# Patient Record
Sex: Male | Born: 1950
Health system: Southern US, Community
[De-identification: ages and names within clinical notes are randomized; demographics above are authoritative.]

## PROBLEM LIST (undated history)

## (undated) DIAGNOSIS — S80212A Abrasion, left knee, initial encounter: Secondary | ICD-10-CM

## (undated) DIAGNOSIS — J189 Pneumonia, unspecified organism: Secondary | ICD-10-CM

## (undated) DIAGNOSIS — R079 Chest pain, unspecified: Secondary | ICD-10-CM

## (undated) DIAGNOSIS — J45909 Unspecified asthma, uncomplicated: Secondary | ICD-10-CM

## (undated) DIAGNOSIS — I4891 Unspecified atrial fibrillation: Secondary | ICD-10-CM

## (undated) HISTORY — PX: COLONOSCOPY: SHX174

## (undated) HISTORY — DX: Chest pain, unspecified: R07.9

---

## 2001-12-18 HISTORY — PX: CARDIAC CATHETERIZATION: SHX172

## 2002-05-06 ENCOUNTER — Ambulatory Visit (HOSPITAL_COMMUNITY): Admission: RE | Admit: 2002-05-06 | Discharge: 2002-05-06 | Payer: Self-pay | Admitting: *Deleted

## 2002-06-03 ENCOUNTER — Ambulatory Visit (HOSPITAL_COMMUNITY): Admission: RE | Admit: 2002-06-03 | Discharge: 2002-06-03 | Payer: Self-pay | Admitting: *Deleted

## 2005-04-11 ENCOUNTER — Encounter: Admission: RE | Admit: 2005-04-11 | Discharge: 2005-07-10 | Payer: Self-pay | Admitting: Family Medicine

## 2008-04-15 ENCOUNTER — Ambulatory Visit (HOSPITAL_COMMUNITY): Admission: RE | Admit: 2008-04-15 | Discharge: 2008-04-15 | Payer: Self-pay | Admitting: Interventional Cardiology

## 2008-04-15 ENCOUNTER — Ambulatory Visit: Payer: Self-pay | Admitting: Cardiovascular Disease

## 2011-05-05 NOTE — Cardiovascular Report (Signed)
[place]. [facility]  Patient:    Frank Trujillo, Frank Trujillo Visit Number: [id number] MRN: [id number]          Service Type: CAT Location: [facility] 2852 [id number] Attending Physician:  Mora Appl Dictated by:   Meade Maw, M.D. Proc. Date: 05/06/02 Admit Date:  05/06/2002 Discharge Date: 05/06/2002   CC:         Miguel Aschoff, M.D.   Cardiac Catheterization  REFERRING PHYSICIAN:  Miguel Aschoff, M.D.  INDICATIONS:  Ongoing chest pain with reversible ischemia noted at the apical region. He has significant ST changes as well.  PROCEDURE PERFORMED: 1. Selective coronary angiography. 2. Left ventriculography.  DESCRIPTION OF PROCEDURE:  After obtaining written informed consent, the patient was brought to the cardiac catheterization lab in the postabsorptive state. Preop sedation was achieved using IV Versed. The right groin was prepped and draped in the usual sterile fashion. Local anesthesia was achieved using 1% Xylocaine. A #6 French hemostasis sheath was placed into the right femoral artery using the modified Seldinger technique. Selective coronary angiography was performed using a JL4 and JR4 Judkins catheters. Multiple views were obtained. The right coronary artery was noted to have initial spasm with engagement and intercoronary nitroglycerin was given. Following the procedure there was no immediate complications. The patient was transferred to the holding area. Hemostasis was achieved using digital pressure.  HEMODYNAMIC DATA: 1. The aortic pressure is 112/70 mmHg. 2. Left ventricular pressure is 115/18 mmHg.  LEFT VENTRICULOGRAPHY: 1. Single plane ventriculogram revealed normal wall motion. 2. The ejection fraction is 60%. 3. There was apical hypertrophy noted.  CORONARY ANGIOGRAPHY: 1. The left main coronary artery bifurcates into the left anterior descending    and circumflex vessel. There is no disease in the left main coronary artery. 2. The left  anterior descending gives rise to a small to moderate D1    and moderate D2. It then goes on to end as an apical recurrent    branch. There is no disease in the left anterior descending artery    or its branches. 3. The circumflex coronary artery is a moderate sized vessel. It gives rise    to a small to moderate OM1 and moderate OM2 and goes on to end as    an AV groove vessel. There is no disease in the circumflex coronary artery    or its branches. 4. The right coronary artery is a dominant vessel and gives rise to    three smaller marginals, small PDA and a PL branch. There is no    disease in the right coronary artery or its branches.  IMPRESSION: 1. Normal coronary angiography. 2. Possible spasm of the right coronary artery accounting for chest    pain syndrome; however, the chest pain was not reproduced with spasm    in the cath lab. 3. Normal wall motion with apical hypertrophy. Dictated by:   Meade Maw, M.D. Attending Physician:  Mora Appl DD:  05/06/02 TD:  05/07/02 Job: [id number]

## 2013-12-18 HISTORY — PX: DOPPLER ECHOCARDIOGRAPHY: SHX263

## 2013-12-18 HISTORY — PX: CARDIOVASCULAR STRESS TEST: SHX262

## 2014-06-01 ENCOUNTER — Encounter: Payer: Self-pay | Admitting: Cardiology

## 2014-06-04 ENCOUNTER — Encounter: Payer: Self-pay | Admitting: *Deleted

## 2014-06-05 ENCOUNTER — Encounter (INDEPENDENT_AMBULATORY_CARE_PROVIDER_SITE_OTHER): Payer: Self-pay

## 2014-06-05 ENCOUNTER — Encounter: Payer: Self-pay | Admitting: Cardiology

## 2014-06-05 ENCOUNTER — Ambulatory Visit (INDEPENDENT_AMBULATORY_CARE_PROVIDER_SITE_OTHER): Payer: Managed Care, Other (non HMO) | Admitting: Cardiology

## 2014-06-05 VITALS — BP 149/91 | HR 66 | Ht 74.0 in | Wt 257.0 lb

## 2014-06-05 DIAGNOSIS — I4891 Unspecified atrial fibrillation: Secondary | ICD-10-CM

## 2014-06-05 DIAGNOSIS — I4819 Other persistent atrial fibrillation: Secondary | ICD-10-CM

## 2014-06-05 MED ORDER — RIVAROXABAN 20 MG PO TABS: 20.0000 mg | ORAL_TABLET | Freq: Every day | ORAL | Status: DC

## 2014-06-05 NOTE — Progress Notes (Signed)
HPI The patient presents for evaluation of atrial fibrillation. He was sent by his primary provider who reports a history of a fibrillation one time in 2013 though he has no recollection of this. He has been having some dyspnea with exertion. This has been new onset over a couple of months. He would notice shortness of breath with activities such as walking up an incline. He recovers quickly when he stops. This was not going on prior to this. He has not had any resting shortness of breath and denies any PND or orthopnea. He denies any chest pressure, neck or arm discomfort. He's had no weight gain or edema. He does not feel any palpitations, presyncope or syncope. However, when he saw his primary provider he was noted to be in fibrillation. This of note the patient did have troponins drawn which were normal.  I did review previous records. He was seen for evaluation of chest discomfort in 2003 and had no obstructive coronary disease but did have some coronary spasm. Because of an abnormal EKG and chest discomfort he had MRI in 2009 area this demonstrated no significant abnormalities.  No Known Allergies  Current Outpatient Prescriptions  Medication Sig Dispense Refill  . Fluticasone-Salmeterol (ADVAIR) 100-50 MCG/DOSE AEPB Inhale 1 puff into the lungs 2 (two) times daily.      . rivaroxaban (XARELTO) 20 MG TABS tablet Take 1 tablet (20 mg total) by mouth daily with supper.  30 tablet  11   No current facility-administered medications for this visit.    Past Medical History  Diagnosis Date  . Chest pain     cardiac cath was done by Dr Jaci Standard , he was discharged from cardiac care with no F/u  needed , no cardiac disease  seen  2003  . SOB (shortness of breath)     chest tightness and dyspnea ,eval 2009 Dr Tamala Julian    . Hypertension   . Diabetes mellitus without complication     Past Surgical History  Procedure Laterality Date  . None      Family History  Problem Relation Age of Onset    . Cancer Father     Liver  . CAD Brother 91    CABG/Valve    History   Social History  . Marital Status: Married    Spouse Name: N/A    Number of Children: 2  . Years of Education: N/A   Occupational History  . Not on file.   Social History Main Topics  . Smoking status: Former Research scientist (life sciences)  . Smokeless tobacco: Not on file  . Alcohol Use: Not on file  . Drug Use: Not on file  . Sexual Activity: Not on file   Other Topics Concern  . Not on file   Social History Narrative   Lives at home with wife and daughter.     ROS:  As stated in the HPI and negative for all other systems.   PHYSICAL EXAM BP 149/91  Pulse 66  Ht 6\' 2"  (1.88 m)  Wt 257 lb (116.574 kg)  BMI 32.98 kg/m2 GENERAL:  Well appearing HEENT:  Pupils equal round and reactive, fundi not visualized, oral mucosa unremarkable NECK:  No jugular venous distention, waveform within normal limits, carotid upstroke brisk and symmetric, no bruits, no thyromegaly LYMPHATICS:  No cervical, inguinal adenopathy LUNGS:  Clear to auscultation bilaterally BACK:  No CVA tenderness CHEST:  Unremarkable HEART:  PMI not displaced or sustained,S1 and S2 within normal limits, no S3, no  clicks, no rubs, no murmurs, irregular ABD:  Flat, positive bowel sounds normal in frequency in pitch, no bruits, no rebound, no guarding, no midline pulsatile mass, no hepatomegaly, no splenomegaly EXT:  2 plus pulses throughout, no edema, no cyanosis no clubbing SKIN:  No rashes no nodules NEURO:  Cranial nerves II through XII grossly intact, motor grossly intact throughout PSYCH:  Cognitively intact, oriented to person place and time  EKG:  Atrial fibrillation, rate 74, axis within normal limits, intervals within normal limits, lateral T-wave inversions consistent with ischemia but without old EKGs for comparison, minimal voltage criteria for hypertrophy. 06/05/2014  ASSESSMENT AND PLAN  ATRIAL FIB:  The patient does have some breathlessness  and might benefit from sinus rhythm. He had a long discussion about the physiology of this and methods of treatment. Cardioversion is indicated. I would like to check an echocardiogram. I will order a 24-hour Holter to make sure that this is persistent fibrillation. He will be started on Xarelto.  The risks and benefits of this have been described.  ABNORMAL EKG:  He has no anginal symptoms. I don't have a previous EKG for comparison. However, he's had an extensive workup in the past with an MRI and catheterization. At this point no ischemia evaluation is planned. I would like to try to retrieve an old EKGs.

## 2014-06-05 NOTE — Patient Instructions (Signed)
Please start Xarelto 20 mg a day (with the evening meal) Continue all other medications as listed.  Your physician has requested that you have an echocardiogram. Echocardiography is a painless test that uses sound waves to create images of your heart. It provides your doctor with information about the size and shape of your heart and how well your heart's chambers and valves are working. This procedure takes approximately one hour. There are no restrictions for this procedure.  Your physician has recommended that you wear a holter monitor for 24 hours. Holter monitors are medical devices that record the heart's electrical activity. Doctors most often use these monitors to diagnose arrhythmias. Arrhythmias are problems with the speed or rhythm of the heartbeat. The monitor is a small, portable device. You can wear one while you do your normal daily activities. This is usually used to diagnose what is causing palpitations/syncope (passing out).  Your physician has recommended that you have a Cardioversion (DCCV) in approximately 4 weeks.  You will be called at a later date for this to be scheduled.  Electrical Cardioversion uses a jolt of electricity to your heart either through paddles or wired patches attached to your chest. This is a controlled, usually prescheduled, procedure. Defibrillation is done under light anesthesia in the hospital, and you usually go home the day of the procedure. This is done to get your heart back into a normal rhythm. You are not awake for the procedure. Please see the instruction sheet given to you today.

## 2014-06-16 ENCOUNTER — Encounter (INDEPENDENT_AMBULATORY_CARE_PROVIDER_SITE_OTHER): Payer: Managed Care, Other (non HMO)

## 2014-06-16 ENCOUNTER — Encounter: Payer: Self-pay | Admitting: *Deleted

## 2014-06-16 ENCOUNTER — Ambulatory Visit (HOSPITAL_COMMUNITY)
Admission: RE | Admit: 2014-06-16 | Discharge: 2014-06-16 | Disposition: A | Discharge status: Home / Self Care | Payer: Managed Care, Other (non HMO) | Source: Ambulatory Visit | Attending: Cardiovascular Disease | Admitting: Cardiovascular Disease

## 2014-06-16 DIAGNOSIS — I4891 Unspecified atrial fibrillation: Secondary | ICD-10-CM

## 2014-06-16 DIAGNOSIS — I517 Cardiomegaly: Secondary | ICD-10-CM

## 2014-06-16 DIAGNOSIS — I4819 Other persistent atrial fibrillation: Secondary | ICD-10-CM

## 2014-06-16 NOTE — Progress Notes (Signed)
Patient ID: Frank Trujillo, male   DOB: [birth year], 63 y.o.   MRN: [id number] E-Cardio 24 hour holter monitor applied to patient.

## 2014-06-16 NOTE — Progress Notes (Signed)
2D Echo Performed 06/16/2014    Marygrace Drought, RCS

## 2014-06-24 ENCOUNTER — Telehealth: Payer: Self-pay | Admitting: Cardiology

## 2014-06-24 NOTE — Telephone Encounter (Signed)
New problem   Pt want to know results of his Echocardiogram. Please call pt.

## 2014-06-24 NOTE — Telephone Encounter (Signed)
New problem    Pt want to know results of his Echocardiogram

## 2014-06-24 NOTE — Telephone Encounter (Signed)
F/u     Pt wanting results.

## 2014-06-25 NOTE — Telephone Encounter (Signed)
Pt still have not received his echo results.

## 2014-06-25 NOTE — Telephone Encounter (Signed)
Returned call to patient echo results given.Appointment scheduled with Dr.Hochrein 07/13/14 to schedule cardioversion.

## 2014-06-29 NOTE — Telephone Encounter (Signed)
ok 

## 2014-07-10 ENCOUNTER — Telehealth: Payer: Self-pay | Admitting: Cardiology

## 2014-07-10 MED ORDER — RIVAROXABAN 20 MG PO TABS: 20.0000 mg | ORAL_TABLET | Freq: Every day | ORAL | Status: DC

## 2014-07-10 NOTE — Telephone Encounter (Signed)
Spoke to [employer] D NO patient needs medication. RN informed patient  3 tabs  E -SENT to [facility]

## 2014-07-10 NOTE — Telephone Encounter (Signed)
Pt is out of town and left his Xarelto at home. Will it be all right for him to be without this for 2 days?If not please call and let him know.

## 2014-07-13 ENCOUNTER — Encounter: Payer: Self-pay | Admitting: Cardiology

## 2014-07-13 ENCOUNTER — Ambulatory Visit (INDEPENDENT_AMBULATORY_CARE_PROVIDER_SITE_OTHER): Payer: Managed Care, Other (non HMO) | Admitting: Cardiology

## 2014-07-13 VITALS — BP 134/80 | HR 65 | Ht 74.0 in | Wt 258.0 lb

## 2014-07-13 DIAGNOSIS — I482 Chronic atrial fibrillation, unspecified: Secondary | ICD-10-CM

## 2014-07-13 DIAGNOSIS — I4891 Unspecified atrial fibrillation: Secondary | ICD-10-CM

## 2014-07-13 DIAGNOSIS — R0602 Shortness of breath: Secondary | ICD-10-CM

## 2014-07-13 DIAGNOSIS — R5381 Other malaise: Secondary | ICD-10-CM

## 2014-07-13 DIAGNOSIS — Z79899 Other long term (current) drug therapy: Secondary | ICD-10-CM

## 2014-07-13 DIAGNOSIS — D689 Coagulation defect, unspecified: Secondary | ICD-10-CM

## 2014-07-13 DIAGNOSIS — R5383 Other fatigue: Secondary | ICD-10-CM

## 2014-07-13 NOTE — Progress Notes (Signed)
HPI The patient presents for evaluation of atrial fibrillation. This is his second visit.  After the first visit I ordered an echocardiogram which demonstrated mild LAE but no significant valvular disease and a normal EF.  He has tolerated Xarelto.  He does not notice palpitations and he has had no syncope or presyncope.  However, he does have some dyspnea as described in the previous note.  He denies PND or orthopnea.  He has had no chest, neck or arm pain.    No Known Allergies  Current Outpatient Prescriptions  Medication Sig Dispense Refill  . Fluticasone-Salmeterol (ADVAIR) 100-50 MCG/DOSE AEPB Inhale 1 puff into the lungs 2 (two) times daily.      . rivaroxaban (XARELTO) 20 MG TABS tablet Take 1 tablet (20 mg total) by mouth daily with supper.  3 tablet  0   No current facility-administered medications for this visit.    Past Medical History  Diagnosis Date  . Chest pain     cardiac cath was done by Dr Jaci Standard , he was discharged from cardiac care with no F/u  needed , no cardiac disease  seen  2003  . SOB (shortness of breath)     chest tightness and dyspnea ,eval 2009 Dr Tamala Julian    . Hypertension   . Diabetes mellitus without complication     Past Surgical History  Procedure Laterality Date  . None      Family History  Problem Relation Age of Onset  . Cancer Father     Liver  . CAD Brother 62    CABG/Valve    History   Social History  . Marital Status: Married    Spouse Name: N/A    Number of Children: 2  . Years of Education: N/A   Occupational History  . Not on file.   Social History Main Topics  . Smoking status: Former Research scientist (life sciences)  . Smokeless tobacco: Not on file  . Alcohol Use: Not on file  . Drug Use: Not on file  . Sexual Activity: Not on file   Other Topics Concern  . Not on file   Social History Narrative   Lives at home with wife and daughter.     ROS:  As stated in the HPI and negative for all other systems.   PHYSICAL EXAM BP 134/80   Pulse 65  Ht 6\' 2"  (1.88 m)  Wt 258 lb (117.028 kg)  BMI 33.11 kg/m2 GENERAL:  Well appearing HEENT:  Pupils equal round and reactive, fundi not visualized, oral mucosa unremarkable NECK:  No jugular venous distention, waveform within normal limits, carotid upstroke brisk and symmetric, no bruits, no thyromegaly LYMPHATICS:  No cervical, inguinal adenopathy LUNGS:  Clear to auscultation bilaterally BACK:  No CVA tenderness CHEST:  Unremarkable HEART:  PMI not displaced or sustained,S1 and S2 within normal limits, no S3, no clicks, no rubs, no murmurs, irregular ABD:  Flat, positive bowel sounds normal in frequency in pitch, no bruits, no rebound, no guarding, no midline pulsatile mass, no hepatomegaly, no splenomegaly EXT:  2 plus pulses throughout, no edema, no cyanosis no clubbing SKIN:  No rashes no nodules NEURO:  Cranial nerves II through XII grossly intact, motor grossly intact throughout PSYCH:  Cognitively intact, oriented to person place and time  EKG:  Atrial fibrillation, rate 65, axis within normal limits, intervals within normal limits, lateral T-wave inversions consistent with ischemia but without old EKGs for comparison, minimal voltage criteria for hypertrophy. 07/13/2014  ASSESSMENT AND  PLAN  ATRIAL FIB:  The patient had a normal echo and permanent atrial fib.  He thinks that he might be slightly more SOB with his fibrillation.  He has missed a couple of doses of Xarelto.  I will plan DCCV in another three weeks after he has been on uninterrupted Xarelto.  We discussed the risk benefits.  I reviewed his risk factors for emboli.  He reports that he actually does not have HTN and his last A1C was 6.  He will not need a NOAC long term at this point although we will continue through the cardioversion and for one month beyond.

## 2014-07-13 NOTE — Patient Instructions (Signed)
Your physician recommends that you schedule a follow-up appointment in: after your cardioversion in one month  It is very important that you do not miss any doses of your Xarelto

## 2014-07-20 ENCOUNTER — Encounter: Payer: Self-pay | Admitting: Cardiology

## 2014-07-20 ENCOUNTER — Telehealth: Payer: Self-pay | Admitting: Cardiology

## 2014-07-20 NOTE — Telephone Encounter (Signed)
Spoke with patient regarding the cardioversion ordered by Dr. Percival Spanish. Scheduled for Monday 08/10/14 at 1:00pm.  I informed patient that I would mail instructions to him regarding time of procedure, arrival time at hospital, etc.  Patient voiced understanding.

## 2014-07-28 ENCOUNTER — Encounter (HOSPITAL_COMMUNITY): Payer: Self-pay | Admitting: Pharmacy Technician

## 2014-08-03 LAB — CBC
HEMATOCRIT: 44.3 % (ref 39.0–52.0)
HEMOGLOBIN: 15.8 g/dL (ref 13.0–17.0)
MCH: 33.3 pg (ref 26.0–34.0)
MCHC: 35.7 g/dL (ref 30.0–36.0)
MCV: 93.3 fL (ref 78.0–100.0)
Platelets: 217 10*3/uL (ref 150–400)
RBC: 4.75 MIL/uL (ref 4.22–5.81)
RDW: 13.3 % (ref 11.5–15.5)
WBC: 7.7 10*3/uL (ref 4.0–10.5)

## 2014-08-03 LAB — PROTIME-INR
INR: 1.26 (ref <1.50)
PROTHROMBIN TIME: 15.8 s — AB (ref 11.6–15.2)

## 2014-08-04 LAB — COMPREHENSIVE METABOLIC PANEL
ALK PHOS: 113 U/L (ref 39–117)
ALT: 28 U/L (ref 0–53)
AST: 19 U/L (ref 0–37)
Albumin: 4.4 g/dL (ref 3.5–5.2)
BILIRUBIN TOTAL: 0.6 mg/dL (ref 0.2–1.2)
BUN: 13 mg/dL (ref 6–23)
CO2: 27 meq/L (ref 19–32)
CREATININE: 0.84 mg/dL (ref 0.50–1.35)
Calcium: 9 mg/dL (ref 8.4–10.5)
Chloride: 101 mEq/L (ref 96–112)
Glucose, Bld: 191 mg/dL — ABNORMAL HIGH (ref 70–99)
Potassium: 4.5 mEq/L (ref 3.5–5.3)
Sodium: 138 mEq/L (ref 135–145)
Total Protein: 6.8 g/dL (ref 6.0–8.3)

## 2014-08-04 LAB — TSH: TSH: 3.107 u[IU]/mL (ref 0.350–4.500)

## 2014-08-04 LAB — APTT: aPTT: 35 seconds (ref 24–37)

## 2014-08-10 ENCOUNTER — Encounter (HOSPITAL_COMMUNITY): Payer: Self-pay

## 2014-08-10 ENCOUNTER — Ambulatory Visit (HOSPITAL_COMMUNITY)
Admission: RE | Admit: 2014-08-10 | Discharge: 2014-08-10 | Disposition: A | Discharge status: Home / Self Care | Payer: Managed Care, Other (non HMO) | Source: Ambulatory Visit | Attending: Cardiology | Admitting: Cardiology

## 2014-08-10 ENCOUNTER — Ambulatory Visit (HOSPITAL_COMMUNITY): Payer: Managed Care, Other (non HMO) | Admitting: Anesthesiology

## 2014-08-10 ENCOUNTER — Encounter (HOSPITAL_COMMUNITY): Payer: Managed Care, Other (non HMO) | Admitting: Anesthesiology

## 2014-08-10 ENCOUNTER — Encounter (HOSPITAL_COMMUNITY)
Admission: RE | Disposition: A | Discharge status: Home / Self Care | Payer: Self-pay | Source: Ambulatory Visit | Attending: Cardiology

## 2014-08-10 ENCOUNTER — Other Ambulatory Visit: Payer: Self-pay | Admitting: *Deleted

## 2014-08-10 DIAGNOSIS — Z79899 Other long term (current) drug therapy: Secondary | ICD-10-CM | POA: Diagnosis not present

## 2014-08-10 DIAGNOSIS — Z87891 Personal history of nicotine dependence: Secondary | ICD-10-CM | POA: Diagnosis not present

## 2014-08-10 DIAGNOSIS — Z7901 Long term (current) use of anticoagulants: Secondary | ICD-10-CM | POA: Diagnosis not present

## 2014-08-10 DIAGNOSIS — I4891 Unspecified atrial fibrillation: Secondary | ICD-10-CM | POA: Insufficient documentation

## 2014-08-10 HISTORY — PX: CARDIOVERSION: SHX1299

## 2014-08-10 SURGERY — CARDIOVERSION
Anesthesia: General

## 2014-08-10 MED ORDER — SODIUM CHLORIDE 0.9 % IV SOLN
INTRAVENOUS | Status: DC
  Administered 2014-08-10: 500 mL via INTRAVENOUS

## 2014-08-10 MED ORDER — SODIUM CHLORIDE 0.9 % IV SOLN: INTRAVENOUS | Status: DC

## 2014-08-10 MED ORDER — LIDOCAINE HCL (CARDIAC) 20 MG/ML IV SOLN
INTRAVENOUS | Status: DC | PRN
  Administered 2014-08-10: 40 mg via INTRAVENOUS

## 2014-08-10 MED ORDER — SODIUM CHLORIDE 0.9 % IV SOLN
INTRAVENOUS | Status: DC | PRN
  Administered 2014-08-10: 13:00:00 via INTRAVENOUS

## 2014-08-10 MED ORDER — HYDROCORTISONE 1 % EX CREA
1.0000 "application " | TOPICAL_CREAM | Freq: Three times a day (TID) | CUTANEOUS | Status: DC | PRN
  Filled 2014-08-10: qty 28

## 2014-08-10 MED ORDER — PROPOFOL 10 MG/ML IV BOLUS
INTRAVENOUS | Status: DC | PRN
  Administered 2014-08-10: 50 mg via INTRAVENOUS
  Administered 2014-08-10: 30 mg via INTRAVENOUS
  Administered 2014-08-10: 10 mg via INTRAVENOUS

## 2014-08-10 NOTE — Transfer of Care (Signed)
Immediate Anesthesia Transfer of Care Note  Patient: Frank Trujillo  Procedure(s) Performed: Procedure(s): CARDIOVERSION (N/A)  Patient Location: Endoscopy Unit  Anesthesia Type:MAC  Level of Consciousness: awake, alert  and oriented  Airway & Oxygen Therapy: Patient Spontanous Breathing  Post-op Assessment: Report given to PACU RN  Post vital signs: stable  Complications: No apparent anesthesia complications

## 2014-08-10 NOTE — Interval H&P Note (Signed)
History and Physical Interval Note:  08/10/2014 1:54 PM  Frank Trujillo  has presented today for surgery, with the diagnosis of A FIB  The various methods of treatment have been discussed with the patient and family. After consideration of risks, benefits and other options for treatment, the patient has consented to  Procedure(s): CARDIOVERSION (N/A) as a surgical intervention .  The patient's history has been reviewed, patient examined, no change in status, stable for surgery.  I have reviewed the patient's chart and labs.  Questions were answered to the patient's satisfaction.     Minus Breeding

## 2014-08-10 NOTE — Anesthesia Preprocedure Evaluation (Addendum)
Anesthesia Evaluation  Patient identified by MRN, date of birth, ID band Patient awake    Reviewed: Allergy & Precautions, H&P , NPO status , Patient's Chart, lab work & pertinent test results  Airway Mallampati: II TM Distance: >3 FB Neck ROM: Full    Dental no notable dental hx.    Pulmonary shortness of breath, former smoker,  breath sounds clear to auscultation  Pulmonary exam normal       Cardiovascular hypertension, Pt. on medications Rhythm:Regular Rate:Normal  Echo 05/2014 - Left ventricle: The cavity size was normal. Systolic function was  normal. The estimated ejection fraction was in the range of 55%  to 60%. Wall motion was normal; there were no regional wall  motion abnormalities. - Mitral valve: There was trivial regurgitation. - Left atrium: The atrium was mildly dilated. - Pulmonic valve: There was trivial regurgitation.    Neuro/Psych negative neurological ROS  negative psych ROS   GI/Hepatic negative GI ROS, Neg liver ROS,   Endo/Other  diabetes, Type 2, Oral Hypoglycemic Agents  Renal/GU negative Renal ROS     Musculoskeletal negative musculoskeletal ROS (+)   Abdominal   Peds  Hematology negative hematology ROS (+)   Anesthesia Other Findings   Reproductive/Obstetrics                       Anesthesia Physical Anesthesia Plan  ASA: II  Anesthesia Plan: General   Post-op Pain Management:    Induction: Intravenous  Airway Management Planned: Mask  Additional Equipment:   Intra-op Plan:   Post-operative Plan:   Informed Consent: I have reviewed the patients History and Physical, chart, labs and discussed the procedure including the risks, benefits and alternatives for the proposed anesthesia with the patient or authorized representative who has indicated his/her understanding and acceptance.   Dental advisory given  Plan Discussed with: CRNA  Anesthesia Plan  Comments:         Anesthesia Quick Evaluation

## 2014-08-10 NOTE — CV Procedure (Signed)
   Procedure:   DCCV  Indication:  Symptomatic atrial fib.  Procedure Note:  The patient signed informed consent.  He has had had therapeutic anticoagulation with Xarelto greater than 3 weeks.  Anesthesia was administered by Dr. Ermalene Postin.  Adequate airway was maintained throughout and vital followed per protocol.  He was cardioverted x 2 with 150 and the 200J of biphasic synchronized energy.  He converted to NSR for only 6 or so beats.  There were no apparent complications.  The patient had normal neuro status and respiratory status post procedure with vitals stable as recorded elsewhere.    Follow up:  We will arrange follow up with me.  He can stop his Xarelto and start a baby ASA.

## 2014-08-10 NOTE — H&P (View-Only) (Signed)
HPI The patient presents for evaluation of atrial fibrillation. This is his second visit.  After the first visit I ordered an echocardiogram which demonstrated mild LAE but no significant valvular disease and a normal EF.  He has tolerated Xarelto.  He does not notice palpitations and he has had no syncope or presyncope.  However, he does have some dyspnea as described in the previous note.  He denies PND or orthopnea.  He has had no chest, neck or arm pain.    No Known Allergies  Current Outpatient Prescriptions  Medication Sig Dispense Refill  . Fluticasone-Salmeterol (ADVAIR) 100-50 MCG/DOSE AEPB Inhale 1 puff into the lungs 2 (two) times daily.      . rivaroxaban (XARELTO) 20 MG TABS tablet Take 1 tablet (20 mg total) by mouth daily with supper.  3 tablet  0   No current facility-administered medications for this visit.    Past Medical History  Diagnosis Date  . Chest pain     cardiac cath was done by Dr Jaci Standard , he was discharged from cardiac care with no F/u  needed , no cardiac disease  seen  2003  . SOB (shortness of breath)     chest tightness and dyspnea ,eval 2009 Dr Tamala Julian    . Hypertension   . Diabetes mellitus without complication     Past Surgical History  Procedure Laterality Date  . None      Family History  Problem Relation Age of Onset  . Cancer Father     Liver  . CAD Brother 37    CABG/Valve    History   Social History  . Marital Status: Married    Spouse Name: N/A    Number of Children: 2  . Years of Education: N/A   Occupational History  . Not on file.   Social History Main Topics  . Smoking status: Former Research scientist (life sciences)  . Smokeless tobacco: Not on file  . Alcohol Use: Not on file  . Drug Use: Not on file  . Sexual Activity: Not on file   Other Topics Concern  . Not on file   Social History Narrative   Lives at home with wife and daughter.     ROS:  As stated in the HPI and negative for all other systems.   PHYSICAL EXAM BP 134/80   Pulse 65  Ht 6\' 2"  (1.88 m)  Wt 258 lb (117.028 kg)  BMI 33.11 kg/m2 GENERAL:  Well appearing HEENT:  Pupils equal round and reactive, fundi not visualized, oral mucosa unremarkable NECK:  No jugular venous distention, waveform within normal limits, carotid upstroke brisk and symmetric, no bruits, no thyromegaly LYMPHATICS:  No cervical, inguinal adenopathy LUNGS:  Clear to auscultation bilaterally BACK:  No CVA tenderness CHEST:  Unremarkable HEART:  PMI not displaced or sustained,S1 and S2 within normal limits, no S3, no clicks, no rubs, no murmurs, irregular ABD:  Flat, positive bowel sounds normal in frequency in pitch, no bruits, no rebound, no guarding, no midline pulsatile mass, no hepatomegaly, no splenomegaly EXT:  2 plus pulses throughout, no edema, no cyanosis no clubbing SKIN:  No rashes no nodules NEURO:  Cranial nerves II through XII grossly intact, motor grossly intact throughout PSYCH:  Cognitively intact, oriented to person place and time  EKG:  Atrial fibrillation, rate 65, axis within normal limits, intervals within normal limits, lateral T-wave inversions consistent with ischemia but without old EKGs for comparison, minimal voltage criteria for hypertrophy. 07/13/2014  ASSESSMENT AND  PLAN  ATRIAL FIB:  The patient had a normal echo and permanent atrial fib.  He thinks that he might be slightly more SOB with his fibrillation.  He has missed a couple of doses of Xarelto.  I will plan DCCV in another three weeks after he has been on uninterrupted Xarelto.  We discussed the risk benefits.  I reviewed his risk factors for emboli.  He reports that he actually does not have HTN and his last A1C was 6.  He will not need a NOAC long term at this point although we will continue through the cardioversion and for one month beyond.

## 2014-08-10 NOTE — Anesthesia Postprocedure Evaluation (Signed)
  Anesthesia Post-op Note  Patient: Frank Trujillo  Procedure(s) Performed: Procedure(s): CARDIOVERSION (N/A)  Patient Location: PACU  Anesthesia Type:MAC  Level of Consciousness: awake and alert   Airway and Oxygen Therapy: Patient Spontanous Breathing  Post-op Pain: none  Post-op Assessment: Post-op Vital signs reviewed, Patient's Cardiovascular Status Stable, Respiratory Function Stable, Patent Airway, No signs of Nausea or vomiting and Pain level controlled  Post-op Vital Signs: Reviewed and stable  Last Vitals:  Filed Vitals:   08/10/14 1435  BP: 109/72  Pulse: 68  Temp:   Resp: 22    Complications: No apparent anesthesia complications

## 2014-08-11 ENCOUNTER — Encounter (HOSPITAL_COMMUNITY): Payer: Self-pay | Admitting: Cardiology

## 2014-09-23 ENCOUNTER — Encounter: Payer: Self-pay | Admitting: Cardiology

## 2014-09-23 ENCOUNTER — Ambulatory Visit (INDEPENDENT_AMBULATORY_CARE_PROVIDER_SITE_OTHER): Payer: Managed Care, Other (non HMO) | Admitting: Cardiology

## 2014-09-23 VITALS — BP 131/84 | HR 74 | Ht 74.0 in | Wt 261.6 lb

## 2014-09-23 DIAGNOSIS — I4891 Unspecified atrial fibrillation: Secondary | ICD-10-CM

## 2014-09-23 NOTE — Progress Notes (Signed)
   HPI The patient presents for evaluation of atrial fibrillation. This is his third visit.  After the first visit I ordered an echocardiogram which demonstrated mild LAE but no significant valvular disease and a normal EF.  He was treated with Xarelto and had cardioversion. However, I was unable to keep him in sinus rhythm for more than several weeks. I took him off of his Xarelto at that point and brought him back today for followup. He doesn't notice the palpitations though he does think he has some very mild dyspnea. He denies any chest pressure, neck or arm discomfort. He said no weight gain or edema. He said no presyncope or syncope. He remains relatively active.  No Known Allergies  Current Outpatient Prescriptions  Medication Sig Dispense Refill  . Fluticasone-Salmeterol (ADVAIR) 250-50 MCG/DOSE AEPB Inhale 1 puff into the lungs daily.       No current facility-administered medications for this visit.    Past Medical History  Diagnosis Date  . Chest pain     cardiac cath was done by Dr Jaci Standard , he was discharged from cardiac care with no F/u  needed , no cardiac disease  seen  2003  . SOB (shortness of breath)     chest tightness and dyspnea ,eval 2009 Dr Tamala Julian    . Hypertension   . Diabetes mellitus without complication     Past Surgical History  Procedure Laterality Date  . None    . Colonoscopy    . Cardioversion N/A 08/10/2014    Procedure: CARDIOVERSION;  Surgeon: Minus Breeding, MD;  Location: [facility] ENDOSCOPY;  Service: Cardiovascular;  Laterality: N/A;     ROS:  As stated in the HPI and negative for all other systems.   PHYSICAL EXAM BP 131/84  Pulse 74  Ht 6\' 2"  (1.88 m)  Wt 261 lb 9.6 oz (118.661 kg)  BMI 33.57 kg/m2 GENERAL:  Well appearing NECK:  No jugular venous distention, waveform within normal limits, carotid upstroke brisk and symmetric, no bruits, no thyromegaly LUNGS:  Clear to auscultation bilaterally BACK:  No CVA tenderness CHEST:   Unremarkable HEART:  PMI not displaced or sustained,S1 and S2 within normal limits, no S3, no clicks, no rubs, no murmurs, irregular ABD:  Flat, positive bowel sounds normal in frequency in pitch, no bruits, no rebound, no guarding, no midline pulsatile mass, no hepatomegaly, no splenomegaly EXT:  2 plus pulses throughout, no edema, no cyanosis no clubbing   ASSESSMENT AND PLAN  ATRIAL FIB:  The patient had a normal echo and permanent atrial fib.  At this point I would like this patient to be considered earlier for possible ablation so that he might avoid anticoagulation in the future and along future antiarrhythmics. I do suspect Dr. Rayann Heman, who I will ask to see the patient in consultation, will likely want to try antiarrhythmic first. In anticipation of that and I will send the patient for a POET (Plain Old Exercise Treadmill).  Of note he had some minimal coronary plaque years ago. For now he will remain on aspirin.

## 2014-09-23 NOTE — Patient Instructions (Signed)
We are ordering a treadmill test  We are also ordering a referral to Dr. Rayann Heman

## 2014-10-09 ENCOUNTER — Telehealth (HOSPITAL_COMMUNITY): Payer: Self-pay

## 2014-10-09 NOTE — Telephone Encounter (Signed)
Encounter complete. 

## 2014-10-13 ENCOUNTER — Telehealth (HOSPITAL_COMMUNITY): Payer: Self-pay

## 2014-10-13 NOTE — Telephone Encounter (Signed)
Encounter complete. 

## 2014-10-14 ENCOUNTER — Encounter: Payer: Self-pay | Admitting: Internal Medicine

## 2014-10-14 ENCOUNTER — Ambulatory Visit (HOSPITAL_COMMUNITY)
Admission: RE | Admit: 2014-10-14 | Discharge: 2014-10-14 | Disposition: A | Discharge status: Home / Self Care | Payer: Managed Care, Other (non HMO) | Source: Ambulatory Visit | Attending: Cardiovascular Disease | Admitting: Cardiovascular Disease

## 2014-10-14 ENCOUNTER — Ambulatory Visit (INDEPENDENT_AMBULATORY_CARE_PROVIDER_SITE_OTHER): Payer: Managed Care, Other (non HMO) | Admitting: Internal Medicine

## 2014-10-14 VITALS — BP 146/88 | HR 74 | Ht 74.0 in | Wt 259.8 lb

## 2014-10-14 DIAGNOSIS — I4891 Unspecified atrial fibrillation: Secondary | ICD-10-CM | POA: Diagnosis not present

## 2014-10-14 DIAGNOSIS — I481 Persistent atrial fibrillation: Secondary | ICD-10-CM

## 2014-10-14 DIAGNOSIS — I4892 Unspecified atrial flutter: Secondary | ICD-10-CM | POA: Insufficient documentation

## 2014-10-14 DIAGNOSIS — I4819 Other persistent atrial fibrillation: Secondary | ICD-10-CM

## 2014-10-14 DIAGNOSIS — R0683 Snoring: Secondary | ICD-10-CM

## 2014-10-14 MED ORDER — RIVAROXABAN 20 MG PO TABS: 20.0000 mg | ORAL_TABLET | Freq: Every day | ORAL | Status: DC

## 2014-10-14 NOTE — Progress Notes (Signed)
Primary Care Physician:  Melinda Crutch, MD Primary Cardiologist:Dr. [facility] Primary Electrophysiologist:Dr. Hartwell Vandiver Referring Physician:Dr. Camaron Cammack Frank Trujillo is a 63 y.o. male with a h/o  persistent atrial fibrillation who presents for consultation in the [facility].  The patient was initially diagnosed with PAF several years ago but thinks he has ben in persistent AF  for the last year. For the most part he says his is unaware of the irregular heart beat,can but does have dyspnea with exertion. He is pending a stress test this pm. Cardiac cath in 2003 which showed normal coronary arteries. He underwent unsuccessful DCCV 08/10/14 only achieving SR a few beats per patient. Echo revealed mild left atrial enlargement.   Today, he denies symptoms of palpitations, chest pain,  orthopnea, PND, lower extremity edema, dizziness, presyncope, syncope, snoring, daytime somnolence, bleeding, or neurologic sequela. The patient is tolerating medications without difficulties and is otherwise without complaint today.    Atrial Fibrillation Risk Factors:  he does have symptoms or diagnosis of sleep apnea.(snoring)   he does not have a history of rheumatic fever.  he does not have a history of alcohol use.  he has a BMI of Body mass index is 33.34 kg/(m^2).Marland Kitchen Filed Weights   10/14/14 0833  Weight: 259 lb 12.8 oz (117.845 kg)    LA size: 59mm   Atrial Fibrillation Management history:  Previous antiarrhythmic drugs -none  Previous cardioversions: -x1  Previous ablations: none  CHADS2VASC score: 0-1( pt states HTN and DM in past, but not currently.) Glucose of 191 noted labs 08/03/14  Anticoagulation history: Xarelto short term for DCCV, ASA currently   Past Medical History  Diagnosis Date  . Chest pain     cardiac cath was done by Dr Jaci Standard , he was discharged from cardiac care with no F/u  needed , no cardiac disease  seen  2003  . SOB (shortness of  breath)     chest tightness and dyspnea ,eval 2009 Dr Tamala Julian    . Hypertension   . Diabetes mellitus without complication    Past Surgical History  Procedure Laterality Date  . None    . Colonoscopy    . Cardioversion N/A 08/10/2014    Procedure: CARDIOVERSION;  Surgeon: Minus Breeding, MD;  Location: [facility] ENDOSCOPY;  Service: Cardiovascular;  Laterality: N/A;    Current Outpatient Prescriptions  Medication Sig Dispense Refill  . Fluticasone-Salmeterol (ADVAIR) 250-50 MCG/DOSE AEPB Inhale 1 puff into the lungs 2 (two) times daily.       . rivaroxaban (XARELTO) 20 MG TABS tablet Take 1 tablet (20 mg total) by mouth daily with supper.  30 tablet  11   No current facility-administered medications for this visit.    No Known Allergies  History   Social History  . Marital Status: Married    Spouse Name: N/A    Number of Children: 2  . Years of Education: N/A   Occupational History  . Not on file.   Social History Main Topics  . Smoking status: Former Research scientist (life sciences)  . Smokeless tobacco: Not on file  . Alcohol Use: No  . Drug Use: No  . Sexual Activity: Not on file   Other Topics Concern  . Not on file   Social History Narrative   Lives at home with wife and daughter.     Family History  Problem Relation Age of Onset  . Cancer Father     Liver  . CAD Brother 90  CABG/Valve   The patient does not have a history of early familial atrial fibrillation or other arrhythmias.  ROS- All systems are reviewed and negative except as per the HPI above.  Physical Exam: Filed Vitals:   10/14/14 0833  BP: 146/88  Pulse: 74  Height: 6\' 2"  (1.88 m)  Weight: 259 lb 12.8 oz (117.845 kg)    GEN- The patient is well appearing, alert and oriented x 3 today.   Head- normocephalic, atraumatic Eyes-  Sclera clear, conjunctiva pink Ears- hearing intact Oropharynx- clear Neck- supple, no JVP Lymph- no cervical lymphadenopathy Lungs- Clear to ausculation bilaterally, normal work of  breathing Heart- Regular rate and rhythm, no murmurs, rubs or gallops, PMI not laterally displaced GI- soft, NT, ND, + BS Extremities- no clubbing, cyanosis, or edema MS- no significant deformity or atrophy Skin- no rash or lesion Psych- euthymic mood, full affect Neuro- strength and sensation are intact  EKG Afib rate controlled@ 74 bpm. Voltage criteria for LVH. ST and marked T wave abnormality, QTc at 425ms.   Echo- Left ventricle: The cavity size was normal. Systolic function was normal. The estimated ejection fraction was in the range of 55% to 60%. Wall motion was normal; there were no regional wall motion abnormalities. - Mitral valve: There was trivial regurgitation. - Left atrium: The atrium was mildly dilated. - Pulmonic valve: There was trivial regurgitation   Epic records are reviewed at length today  Assessment and Plan:  1. Atrial fibrillation The patient has minimally symptomatic persistent atrial fibrillation.  The patients CHAD2VASC score is 0-1  he is  appropriately anticoagulated at this time. The patient is adequately rate controlled without meds .Marland Kitchen Antiarrhythmic therapy to dates has included- none  The patients left atrial size is 85mm.  Additional echo findings include normal EF. A long discussion with the patient was had today regarding therapeutic strategies.  Extensive discussion of lifestyle modification including begin progressive daily aerobic exercise program and attempt to lose weight was also discussed.  Presently, our recommendations include:  1. Discussed with pt that it is worthwhile to try to return SR to see if dyspnea with exertion improves. According to guidelines, with pt minimally symptomatic, and never trying an antiarrythmic, it would be prudent to try antiarrhythmic first.  Therefore, will anticoagulant  with Xarelto 20 mg qd  x 3 weeks. He will return to AF clinic that time and will be started on Flecainide 100 mg bid and metoprolol  succinate 12.5 mg qd.   Return in one week after flecainide started for EKG. IF still hasn't converted, schedule  DCCV..  2. Morbid obesity As above, lifestyle modification was discussed at length including regular exercise and weight reduction.  3. Snoring history No significant h/o sleep apnea but does snore. Consider sleep study in future of symptoms are not improving  4. F//u with Dr. Rayann Heman in 2 months.       Roderic Palau NP  Nurse Practitioner, [facility] 10/15/2014 9:09 AM   I have seen, examined the patient, and reviewed the above assessment and plan.  Changes to above are made where necessary.  He appears to be minimally symptomatic with afib (which appears to be longstanding).  I would like to assess for symptomatic improvement with sinus rhythm.  I will therefore restart xarelto and then add flecainide after 3 weeks of anticoagulation.  If he does not spontaneously convert on flecainide then would advise cardioversion 1 week later.  Return to see me 4 weeks  after cardioversion.  If he does not improve symptomatically then rate control would be a reasonable long term strategy.  If his symptoms are improved then rhythm control would be advised long term.  Co Sign: Thompson Grayer, MD 10/15/2014 9:09 AM

## 2014-10-14 NOTE — Procedures (Signed)
Exercise Treadmill Test  Test  Exercise Tolerance Test Ordering MD: Marijo File, MD    Unique Test No: 1  Treadmill:  1  Indication for ETT: exertional dyspnea  Contraindication to ETT: No   Stress Modality: exercise - treadmill  Cardiac Imaging Performed: non   Protocol: standard Bruce - maximal  Max BP:  219/78  Max MPHR (bpm):  157 85% MPR (bpm):  133  MPHR obtained (bpm):  196 % MPHR obtained:  124  Reached 85% MPHR (min:sec):  1:45 Total Exercise Time (min-sec):  5  Workload in METS:  7.0 Borg Scale: 14  Reason ETT Terminated:  ST changes, DOE and fatigue    ST Segment Analysis At Rest: Atrial fibrillation, inferior lateral ST depression and T wave inversion. With Exercise: The patient had increased ST depression with exercise but difficult to interpret in setting of baseline ECG changes.  Other Information Arrhythmia:  Patient in atrial fibrillation during the study. Angina during ETT:  absent (0) Quality of ETT:  indeterminate  ETT Interpretation:  There was evidence of ST depression consistent with ischemia but difficult to interpret due to baseline ST changes.  Comments: ETT with poor exercise tolerance; hypertensive response; no chest pain; there was ST depression in the inferior lateral leads but difficult to interpret due to baseline ECG changes; suggest stress echo or nuclear study to further asess if clinically indicated.  Kirk Ruths

## 2014-10-14 NOTE — Patient Instructions (Addendum)
Your physician recommends that you schedule a follow-up appointment in: 3 weeks in the afib clinic at hospital with Roderic Palau, NP  Your physician has recommended you make the following change in your medication:  1) Start Xarelto 20mg  daily 2) Stop Aspirin

## 2014-10-15 DIAGNOSIS — R0683 Snoring: Secondary | ICD-10-CM | POA: Insufficient documentation

## 2014-10-25 ENCOUNTER — Ambulatory Visit: Payer: Self-pay | Admitting: Orthopedic Surgery

## 2014-10-26 ENCOUNTER — Ambulatory Visit: Payer: Self-pay | Admitting: Orthopedic Surgery

## 2014-10-26 NOTE — Progress Notes (Signed)
Please put orders in Epic surgery 10-30-14 pre op 10-27-14 Thanks

## 2014-10-27 ENCOUNTER — Ambulatory Visit (HOSPITAL_COMMUNITY)
Admission: RE | Admit: 2014-10-27 | Discharge: 2014-10-27 | Disposition: A | Discharge status: Home / Self Care | Payer: Managed Care, Other (non HMO) | Source: Ambulatory Visit | Attending: Anesthesiology | Admitting: Anesthesiology

## 2014-10-27 ENCOUNTER — Encounter (HOSPITAL_COMMUNITY)
Admission: RE | Admit: 2014-10-27 | Discharge: 2014-10-27 | Disposition: A | Discharge status: Home / Self Care | Payer: Managed Care, Other (non HMO) | Source: Ambulatory Visit | Attending: Specialist | Admitting: Specialist

## 2014-10-27 ENCOUNTER — Encounter (HOSPITAL_COMMUNITY): Payer: Self-pay

## 2014-10-27 DIAGNOSIS — Z01818 Encounter for other preprocedural examination: Secondary | ICD-10-CM | POA: Insufficient documentation

## 2014-10-27 DIAGNOSIS — I10 Essential (primary) hypertension: Secondary | ICD-10-CM | POA: Diagnosis not present

## 2014-10-27 DIAGNOSIS — J90 Pleural effusion, not elsewhere classified: Secondary | ICD-10-CM | POA: Insufficient documentation

## 2014-10-27 HISTORY — DX: Unspecified asthma, uncomplicated: J45.909

## 2014-10-27 HISTORY — DX: Pneumonia, unspecified organism: J18.9

## 2014-10-27 HISTORY — DX: Abrasion, left knee, initial encounter: S80.212A

## 2014-10-27 HISTORY — DX: Unspecified atrial fibrillation: I48.91

## 2014-10-27 LAB — BASIC METABOLIC PANEL
Anion gap: 10 (ref 5–15)
BUN: 12 mg/dL (ref 6–23)
CO2: 28 mEq/L (ref 19–32)
Calcium: 9.3 mg/dL (ref 8.4–10.5)
Chloride: 92 mEq/L — ABNORMAL LOW (ref 96–112)
Creatinine, Ser: 0.88 mg/dL (ref 0.50–1.35)
GFR calc Af Amer: >90 mL/min (ref >90)
GFR calc non Af Amer: 90 mL/min — ABNORMAL LOW (ref >90)
Glucose, Bld: 267 mg/dL — ABNORMAL HIGH (ref 70–99)
Potassium: 4.6 mEq/L (ref 3.7–5.3)
Sodium: 130 mEq/L — ABNORMAL LOW (ref 137–147)

## 2014-10-27 LAB — CBC
HCT: 41.9 % (ref 39.0–52.0)
Hemoglobin: 14.5 g/dL (ref 13.0–17.0)
MCH: 31.6 pg (ref 26.0–34.0)
MCHC: 34.6 g/dL (ref 30.0–36.0)
MCV: 91.3 fL (ref 78.0–100.0)
Platelets: 310 10*3/uL (ref 150–400)
RBC: 4.59 MIL/uL (ref 4.22–5.81)
RDW: 12.1 % (ref 11.5–15.5)
WBC: 12.2 10*3/uL — ABNORMAL HIGH (ref 4.0–10.5)

## 2014-10-27 NOTE — Patient Instructions (Signed)
Frank Trujillo  10/27/2014   Your procedure is scheduled on: Friday 10/30/14  Report to [facility] at 11:00 AM.  Call this number if you have problems the morning of surgery [phone]: [phone]   Remember: please bring inhaler on day of surgery   Do not eat food or drink liquids After Midnight.     Take these medicines the morning of surgery with A SIP OF WATER: Keflex, inhaler   Do not wear jewelry, make-up or nail polish.  Do not wear lotions, powders, or perfumes.   Do not shave 48 hours prior to surgery. Men may shave face and neck.  Do not bring valuables to the hospital.  Contacts, dentures or bridgework may not be worn into surgery.  Leave suitcase in the car. After surgery it may be brought to your room.    [place] - Preparing for Surgery Before surgery, you can play an important role.  Because skin is not sterile, your skin needs to be as free of germs as possible.  You can reduce the number of germs on your skin by washing with CHG (chlorahexidine gluconate) soap before surgery.  CHG is an antiseptic cleaner which kills germs and bonds with the skin to continue killing germs even after washing. Please DO NOT use if you have an allergy to CHG or antibacterial soaps.  If your skin becomes reddened/irritated stop using the CHG and inform your nurse when you arrive at Short Stay. Do not shave (including legs and underarms) for at least 48 hours prior to the first CHG shower.  You may shave your face/neck. Please follow these instructions carefully:  1.  Shower with CHG Soap the night before surgery and the  morning of Surgery.  2.  If you choose to wash your hair, wash your hair first as usual with your  normal  shampoo.  3.  After you shampoo, rinse your hair and body thoroughly to remove the  shampoo.                            4.  Use CHG as you would any other liquid soap.  You can apply chg directly  to the skin and wash                       Gently  with a scrungie or clean washcloth.  5.  Apply the CHG Soap to your body ONLY FROM THE NECK DOWN.   Do not use on face/ open                           Wound or open sores. Avoid contact with eyes, ears mouth and genitals (private parts).                       Wash face,  Genitals (private parts) with your normal soap.             6.  Wash thoroughly, paying special attention to the area where your surgery  will be performed.  7.  Thoroughly rinse your body with warm water from the neck down.  8.  DO NOT shower/wash with your normal soap after using and rinsing off  the CHG Soap.                9.  Pat yourself dry with a  clean towel.            10.  Wear clean pajamas.            11.  Place clean sheets on your bed the night of your first shower and do not  sleep with pets. Day of Surgery : Do not apply any lotions/deodorants the morning of surgery.  Please wear clean clothes to the hospital/surgery center.  FAILURE TO FOLLOW THESE INSTRUCTIONS MAY RESULT IN THE CANCELLATION OF YOUR SURGERY PATIENT SIGNATURE_________________________________  NURSE SIGNATURE__________________________________  ________________________________________________________________________   Frank Trujillo  An incentive spirometer is a tool that can help keep your lungs clear and active. This tool measures how well you are filling your lungs with each breath. Taking long deep breaths may help reverse or decrease the chance of developing breathing (pulmonary) problems (especially infection) following:  A long period of time when you are unable to move or be active. BEFORE THE PROCEDURE   If the spirometer includes an indicator to show your best effort, your nurse or respiratory therapist will set it to a desired goal.  If possible, sit up straight or lean slightly forward. Try not to slouch.  Hold the incentive spirometer in an upright position. INSTRUCTIONS FOR USE  1. Sit on the edge of your bed if  possible, or sit up as far as you can in bed or on a chair. 2. Hold the incentive spirometer in an upright position. 3. Breathe out normally. 4. Place the mouthpiece in your mouth and seal your lips tightly around it. 5. Breathe in slowly and as deeply as possible, raising the piston or the ball toward the top of the column. 6. Hold your breath for 3-5 seconds or for as long as possible. Allow the piston or ball to fall to the bottom of the column. 7. Remove the mouthpiece from your mouth and breathe out normally. 8. Rest for a few seconds and repeat Steps 1 through 7 at least 10 times every 1-2 hours when you are awake. Take your time and take a few normal breaths between deep breaths. 9. The spirometer may include an indicator to show your best effort. Use the indicator as a goal to work toward during each repetition. 10. After each set of 10 deep breaths, practice coughing to be sure your lungs are clear. If you have an incision (the cut made at the time of surgery), support your incision when coughing by placing a pillow or rolled up towels firmly against it. Once you are able to get out of bed, walk around indoors and cough well. You may stop using the incentive spirometer when instructed by your caregiver.  RISKS AND COMPLICATIONS  Take your time so you do not get dizzy or light-headed.  If you are in pain, you may need to take or ask for pain medication before doing incentive spirometry. It is harder to take a deep breath if you are having pain. AFTER USE  Rest and breathe slowly and easily.  It can be helpful to keep track of a log of your progress. Your caregiver can provide you with a simple table to help with this. If you are using the spirometer at home, follow these instructions: Frank Trujillo IF:   You are having difficultly using the spirometer.  You have trouble using the spirometer as often as instructed.  Your pain medication is not giving enough relief while using  the spirometer.  You develop fever of 100.5 F (38.1 C)  or higher. SEEK IMMEDIATE MEDICAL CARE IF:   You cough up bloody sputum that had not been present before.  You develop fever of 102 F (38.9 C) or greater.  You develop worsening pain at or near the incision site. MAKE SURE YOU:   Understand these instructions.  Will watch your condition.  Will get help right away if you are not doing well or get worse. Document Released: 04/16/2007 Document Revised: 02/26/2012 Document Reviewed: 06/17/2007 [facility] Patient Information 2014 [place], [place].   ________________________________________________________________________

## 2014-10-27 NOTE — Progress Notes (Signed)
EKG 10/14/14 on EPIC

## 2014-10-27 NOTE — Progress Notes (Signed)
Abnormal chest x-ray showed to Dr. Lissa Hoard with anesthesia. No new orders. Abnormal chest x-ray and BMET results faxed to Dr. Tonita Cong via EPIC.  Pt stated that his cardiac clearance was faxed to Dr. Reather Littler office.  Left message with Orson Slick at [facility] to fax clearance here.

## 2014-10-29 ENCOUNTER — Ambulatory Visit: Payer: Self-pay | Admitting: Orthopedic Surgery

## 2014-10-29 ENCOUNTER — Telehealth: Payer: Self-pay | Admitting: Internal Medicine

## 2014-10-29 MED ORDER — DEXTROSE 5 % IV SOLN
3.0000 g | INTRAVENOUS | Status: AC
  Administered 2014-10-30: 3000 mg via INTRAVENOUS
  Filled 2014-10-29: qty 3000

## 2014-10-29 NOTE — Telephone Encounter (Signed)
Faxed this to [facility] last week  Will send again [facility] aware

## 2014-10-29 NOTE — Telephone Encounter (Signed)
F/u    Pt calling about clearance

## 2014-10-29 NOTE — Progress Notes (Signed)
Cardiac clearnace note requested from dr beane's office, left message with sherry wills.
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
## 2014-10-29 NOTE — Anesthesia Preprocedure Evaluation (Addendum)
Anesthesia Evaluation  Patient identified by MRN, date of birth, ID band Patient awake    Reviewed: Allergy & Precautions, H&P , NPO status , Patient's Chart, lab work & pertinent test results  History of Anesthesia Complications Negative for: history of anesthetic complications  Airway Mallampati: II  TM Distance: >3 FB Neck ROM: Full    Dental no notable dental hx. (+) Dental Advisory Given   Pulmonary asthma , former smoker,  breath sounds clear to auscultation  Pulmonary exam normal       Cardiovascular Exercise Tolerance: Good hypertension, + dysrhythmias Atrial Fibrillation Rhythm:Regular Rate:Normal  Exercise Stress Testing: Notes Recorded by Minus Breeding, MD on 10/15/2014 at 5:07 PM Equivocal study. This was done prior to starting Flecainide. The pretest probability of obstructive CAD is low.  No further testing. I will forward the results to Dr. Rayann Heman   Neuro/Psych negative neurological ROS  negative psych ROS   GI/Hepatic negative GI ROS, Neg liver ROS,   Endo/Other  negative endocrine ROS  Renal/GU negative Renal ROS  negative genitourinary   Musculoskeletal negative musculoskeletal ROS (+)   Abdominal   Peds negative pediatric ROS (+)  Hematology negative hematology ROS (+)   Anesthesia Other Findings   Reproductive/Obstetrics negative OB ROS                          Anesthesia Physical Anesthesia Plan  ASA: II  Anesthesia Plan: General   Post-op Pain Management:    Induction: Intravenous  Airway Management Planned: LMA  Additional Equipment:   Intra-op Plan:   Post-operative Plan: Extubation in OR  Informed Consent: I have reviewed the patients History and Physical, chart, labs and discussed the procedure including the risks, benefits and alternatives for the proposed anesthesia with the patient or authorized representative who has indicated his/her  understanding and acceptance.   Dental advisory given  Plan Discussed with: CRNA  Anesthesia Plan Comments:        Anesthesia Quick Evaluation

## 2014-10-29 NOTE — Telephone Encounter (Signed)
New message   Office calling   Clearance was fax over 11/6 . Surgery on 11/13.    Office will be re faxing over clearance. Need a call back today.

## 2014-10-29 NOTE — Progress Notes (Signed)
Cardiac clearnce note dr Martinique on chart for 10-30-14 surgery
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
## 2014-10-30 ENCOUNTER — Other Ambulatory Visit: Payer: Self-pay

## 2014-10-30 ENCOUNTER — Ambulatory Visit (HOSPITAL_COMMUNITY): Payer: Managed Care, Other (non HMO) | Admitting: Anesthesiology

## 2014-10-30 ENCOUNTER — Encounter (HOSPITAL_COMMUNITY): Payer: Self-pay | Admitting: *Deleted

## 2014-10-30 ENCOUNTER — Encounter (HOSPITAL_COMMUNITY)
Admission: RE | Disposition: A | Discharge status: Home Health | Payer: Managed Care, Other (non HMO) | Source: Ambulatory Visit | Attending: Specialist

## 2014-10-30 ENCOUNTER — Inpatient Hospital Stay (HOSPITAL_COMMUNITY)
Admission: RE | Admit: 2014-10-30 | Discharge: 2014-10-31 | DRG: 489 | Disposition: A | Discharge status: Home Health | Payer: Managed Care, Other (non HMO) | Source: Ambulatory Visit | Attending: Specialist | Admitting: Specialist

## 2014-10-30 DIAGNOSIS — I4891 Unspecified atrial fibrillation: Secondary | ICD-10-CM | POA: Diagnosis present

## 2014-10-30 DIAGNOSIS — Z87891 Personal history of nicotine dependence: Secondary | ICD-10-CM | POA: Diagnosis not present

## 2014-10-30 DIAGNOSIS — S76112A Strain of left quadriceps muscle, fascia and tendon, initial encounter: Principal | ICD-10-CM | POA: Diagnosis present

## 2014-10-30 DIAGNOSIS — S838X2A Sprain of other specified parts of left knee, initial encounter: Secondary | ICD-10-CM | POA: Diagnosis present

## 2014-10-30 DIAGNOSIS — M25562 Pain in left knee: Secondary | ICD-10-CM | POA: Diagnosis present

## 2014-10-30 DIAGNOSIS — I10 Essential (primary) hypertension: Secondary | ICD-10-CM | POA: Diagnosis present

## 2014-10-30 DIAGNOSIS — T148XXA Other injury of unspecified body region, initial encounter: Secondary | ICD-10-CM

## 2014-10-30 HISTORY — PX: QUADRICEPS TENDON REPAIR: SHX756

## 2014-10-30 LAB — CK TOTAL AND CKMB (NOT AT ARMC)
CK, MB: 2.6 ng/mL (ref 0.3–4.0)
Relative Index: 2.4 (ref 0.0–2.5)
Total CK: 110 U/L (ref 7–232)

## 2014-10-30 LAB — TROPONIN I

## 2014-10-30 SURGERY — REPAIR, TENDON, QUADRICEPS
Anesthesia: General | Site: Knee | Laterality: Left

## 2014-10-30 MED ORDER — ONDANSETRON HCL 4 MG/2ML IJ SOLN
INTRAMUSCULAR | Status: DC | PRN
  Administered 2014-10-30: 4 mg via INTRAVENOUS

## 2014-10-30 MED ORDER — PHENOL 1.4 % MT LIQD
1.0000 | OROMUCOSAL | Status: DC | PRN
  Filled 2014-10-30: qty 177

## 2014-10-30 MED ORDER — PROPOFOL 10 MG/ML IV BOLUS
INTRAVENOUS | Status: AC
  Filled 2014-10-30: qty 20

## 2014-10-30 MED ORDER — ONDANSETRON HCL 4 MG/2ML IJ SOLN
INTRAMUSCULAR | Status: AC
  Filled 2014-10-30: qty 2

## 2014-10-30 MED ORDER — HYDROMORPHONE HCL 1 MG/ML IJ SOLN
INTRAMUSCULAR | Status: DC | PRN
  Administered 2014-10-30: 1 mg via INTRAVENOUS
  Administered 2014-10-30 (×2): 0.5 mg via INTRAVENOUS

## 2014-10-30 MED ORDER — SODIUM CHLORIDE 0.45 % IV SOLN
INTRAVENOUS | Status: DC
  Administered 2014-10-30: via INTRAVENOUS
  Administered 2014-10-30: 1000 mL via INTRAVENOUS

## 2014-10-30 MED ORDER — BUPIVACAINE-EPINEPHRINE 0.5% -1:200000 IJ SOLN
INTRAMUSCULAR | Status: DC | PRN
  Administered 2014-10-30: 11 mL

## 2014-10-30 MED ORDER — DIPHENHYDRAMINE HCL 12.5 MG/5ML PO ELIX: 12.5000 mg | ORAL_SOLUTION | ORAL | Status: DC | PRN

## 2014-10-30 MED ORDER — DEXTROSE 5 % IV SOLN
500.0000 mg | Freq: Four times a day (QID) | INTRAVENOUS | Status: DC | PRN
  Administered 2014-10-30: 500 mg via INTRAVENOUS
  Filled 2014-10-30 (×2): qty 5

## 2014-10-30 MED ORDER — ACETAMINOPHEN 650 MG RE SUPP: 650.0000 mg | Freq: Four times a day (QID) | RECTAL | Status: DC | PRN

## 2014-10-30 MED ORDER — FENTANYL CITRATE 0.05 MG/ML IJ SOLN
INTRAMUSCULAR | Status: DC | PRN
  Administered 2014-10-30 (×2): 100 ug via INTRAVENOUS
  Administered 2014-10-30: 50 ug via INTRAVENOUS

## 2014-10-30 MED ORDER — PHENYLEPHRINE HCL 10 MG/ML IJ SOLN
INTRAMUSCULAR | Status: DC | PRN
  Administered 2014-10-30: 40 ug via INTRAVENOUS

## 2014-10-30 MED ORDER — LACTATED RINGERS IV SOLN: INTRAVENOUS | Status: DC

## 2014-10-30 MED ORDER — INFLUENZA VAC SPLIT QUAD 0.5 ML IM SUSY
0.5000 mL | PREFILLED_SYRINGE | INTRAMUSCULAR | Status: DC
  Filled 2014-10-30 (×2): qty 0.5

## 2014-10-30 MED ORDER — ONDANSETRON HCL 4 MG/2ML IJ SOLN: 4.0000 mg | Freq: Once | INTRAMUSCULAR | Status: DC | PRN

## 2014-10-30 MED ORDER — ROCURONIUM BROMIDE 100 MG/10ML IV SOLN
INTRAVENOUS | Status: AC
Start: 2014-10-30 — End: 2014-10-30
  Filled 2014-10-30: qty 1

## 2014-10-30 MED ORDER — METOPROLOL TARTRATE 1 MG/ML IV SOLN
INTRAVENOUS | Status: AC
  Filled 2014-10-30: qty 5

## 2014-10-30 MED ORDER — ACETAMINOPHEN 325 MG PO TABS: 650.0000 mg | ORAL_TABLET | Freq: Four times a day (QID) | ORAL | Status: DC | PRN

## 2014-10-30 MED ORDER — MOMETASONE FURO-FORMOTEROL FUM 100-5 MCG/ACT IN AERO
2.0000 | INHALATION_SPRAY | Freq: Two times a day (BID) | RESPIRATORY_TRACT | Status: DC
  Administered 2014-10-30 – 2014-10-31 (×2): 2 via RESPIRATORY_TRACT
  Filled 2014-10-30: qty 8.8

## 2014-10-30 MED ORDER — RIVAROXABAN 20 MG PO TABS: 20.0000 mg | ORAL_TABLET | Freq: Every day | ORAL | Status: DC

## 2014-10-30 MED ORDER — ESMOLOL HCL 10 MG/ML IV SOLN
INTRAVENOUS | Status: DC | PRN
  Administered 2014-10-30: 20 mg via INTRAVENOUS
  Administered 2014-10-30: 10 mg via INTRAVENOUS
  Administered 2014-10-30: 40 mg via INTRAVENOUS
  Administered 2014-10-30: 10 mg via INTRAVENOUS
  Administered 2014-10-30: 20 mg via INTRAVENOUS
  Administered 2014-10-30 (×4): 10 mg via INTRAVENOUS

## 2014-10-30 MED ORDER — METOCLOPRAMIDE HCL 5 MG/ML IJ SOLN: 5.0000 mg | Freq: Three times a day (TID) | INTRAMUSCULAR | Status: DC | PRN

## 2014-10-30 MED ORDER — MIDAZOLAM HCL 2 MG/2ML IJ SOLN
INTRAMUSCULAR | Status: AC
  Filled 2014-10-30: qty 2

## 2014-10-30 MED ORDER — MAGNESIUM CITRATE PO SOLN: 1.0000 | Freq: Once | ORAL | Status: AC | PRN

## 2014-10-30 MED ORDER — METHOCARBAMOL 500 MG PO TABS: 500.0000 mg | ORAL_TABLET | Freq: Three times a day (TID) | ORAL | Status: DC | PRN

## 2014-10-30 MED ORDER — BISACODYL 5 MG PO TBEC: 5.0000 mg | DELAYED_RELEASE_TABLET | Freq: Every day | ORAL | Status: DC | PRN

## 2014-10-30 MED ORDER — LIDOCAINE HCL (CARDIAC) 20 MG/ML IV SOLN
INTRAVENOUS | Status: AC
  Filled 2014-10-30: qty 5

## 2014-10-30 MED ORDER — METOCLOPRAMIDE HCL 10 MG PO TABS: 5.0000 mg | ORAL_TABLET | Freq: Three times a day (TID) | ORAL | Status: DC | PRN

## 2014-10-30 MED ORDER — MIDAZOLAM HCL 5 MG/5ML IJ SOLN
INTRAMUSCULAR | Status: DC | PRN
  Administered 2014-10-30: 2 mg via INTRAVENOUS

## 2014-10-30 MED ORDER — METHOCARBAMOL 500 MG PO TABS
500.0000 mg | ORAL_TABLET | Freq: Four times a day (QID) | ORAL | Status: DC | PRN
  Administered 2014-10-30: 500 mg via ORAL
  Filled 2014-10-30: qty 1

## 2014-10-30 MED ORDER — RIVAROXABAN 20 MG PO TABS
20.0000 mg | ORAL_TABLET | Freq: Every day | ORAL | Status: DC
  Filled 2014-10-30: qty 1

## 2014-10-30 MED ORDER — SODIUM CHLORIDE 0.9 % IR SOLN
Status: DC | PRN
  Administered 2014-10-30: 500 mL

## 2014-10-30 MED ORDER — POLYMYXIN B SULFATE 500000 UNITS IJ SOLR
INTRAMUSCULAR | Status: AC
  Filled 2014-10-30: qty 1

## 2014-10-30 MED ORDER — BUPIVACAINE-EPINEPHRINE (PF) 0.5% -1:200000 IJ SOLN
INTRAMUSCULAR | Status: AC
  Filled 2014-10-30: qty 30

## 2014-10-30 MED ORDER — DOCUSATE SODIUM 100 MG PO CAPS
100.0000 mg | ORAL_CAPSULE | Freq: Two times a day (BID) | ORAL | Status: DC
  Administered 2014-10-30 – 2014-10-31 (×2): 100 mg via ORAL

## 2014-10-30 MED ORDER — LIDOCAINE HCL (CARDIAC) 20 MG/ML IV SOLN
INTRAVENOUS | Status: DC | PRN
  Administered 2014-10-30: 50 mg via INTRAVENOUS

## 2014-10-30 MED ORDER — HYDROMORPHONE HCL 1 MG/ML IJ SOLN
0.2500 mg | INTRAMUSCULAR | Status: DC | PRN
  Administered 2014-10-30 (×4): 0.5 mg via INTRAVENOUS

## 2014-10-30 MED ORDER — CEFAZOLIN SODIUM 1-5 GM-% IV SOLN
1.0000 g | Freq: Four times a day (QID) | INTRAVENOUS | Status: AC
  Administered 2014-10-30 – 2014-10-31 (×3): 1 g via INTRAVENOUS
  Filled 2014-10-30 (×3): qty 50

## 2014-10-30 MED ORDER — SENNOSIDES-DOCUSATE SODIUM 8.6-50 MG PO TABS: 1.0000 | ORAL_TABLET | Freq: Every evening | ORAL | Status: DC | PRN

## 2014-10-30 MED ORDER — LACTATED RINGERS IV SOLN
INTRAVENOUS | Status: DC
  Administered 2014-10-30: 1000 mL via INTRAVENOUS
  Administered 2014-10-30: 14:00:00 via INTRAVENOUS

## 2014-10-30 MED ORDER — LABETALOL HCL 5 MG/ML IV SOLN
INTRAVENOUS | Status: AC
Start: 2014-10-30 — End: 2014-10-30
  Administered 2014-10-30: 5 mg via INTRAVENOUS
  Filled 2014-10-30: qty 4

## 2014-10-30 MED ORDER — FENTANYL CITRATE 0.05 MG/ML IJ SOLN: 25.0000 ug | INTRAMUSCULAR | Status: DC | PRN

## 2014-10-30 MED ORDER — ONDANSETRON HCL 4 MG PO TABS: 4.0000 mg | ORAL_TABLET | Freq: Four times a day (QID) | ORAL | Status: DC | PRN

## 2014-10-30 MED ORDER — LABETALOL HCL 5 MG/ML IV SOLN
5.0000 mg | INTRAVENOUS | Status: DC | PRN
  Administered 2014-10-30 (×2): 5 mg via INTRAVENOUS

## 2014-10-30 MED ORDER — HYDROMORPHONE HCL 1 MG/ML IJ SOLN
INTRAMUSCULAR | Status: AC
  Filled 2014-10-30: qty 1

## 2014-10-30 MED ORDER — METOPROLOL TARTRATE 1 MG/ML IV SOLN
INTRAVENOUS | Status: DC | PRN
  Administered 2014-10-30 (×4): 1 mg via INTRAVENOUS

## 2014-10-30 MED ORDER — PROPOFOL 10 MG/ML IV BOLUS
INTRAVENOUS | Status: DC | PRN
  Administered 2014-10-30: 180 mg via INTRAVENOUS

## 2014-10-30 MED ORDER — GLYCOPYRROLATE 0.2 MG/ML IJ SOLN
INTRAMUSCULAR | Status: DC | PRN
  Administered 2014-10-30: 0.6 mg via INTRAVENOUS

## 2014-10-30 MED ORDER — OXYCODONE HCL 5 MG PO TABS
5.0000 mg | ORAL_TABLET | ORAL | Status: DC | PRN
  Administered 2014-10-30: 5 mg via ORAL
  Administered 2014-10-31 (×3): 10 mg via ORAL
  Filled 2014-10-30: qty 1
  Filled 2014-10-30 (×2): qty 2

## 2014-10-30 MED ORDER — ESMOLOL HCL 10 MG/ML IV SOLN
INTRAVENOUS | Status: AC
  Filled 2014-10-30: qty 10

## 2014-10-30 MED ORDER — PHENYLEPHRINE 40 MCG/ML (10ML) SYRINGE FOR IV PUSH (FOR BLOOD PRESSURE SUPPORT)
PREFILLED_SYRINGE | INTRAVENOUS | Status: AC
  Filled 2014-10-30: qty 10

## 2014-10-30 MED ORDER — HYDROMORPHONE HCL 1 MG/ML IJ SOLN: 1.0000 mg | INTRAMUSCULAR | Status: DC | PRN

## 2014-10-30 MED ORDER — FENTANYL CITRATE 0.05 MG/ML IJ SOLN
INTRAMUSCULAR | Status: AC
  Filled 2014-10-30: qty 5

## 2014-10-30 MED ORDER — DOCUSATE SODIUM 100 MG PO CAPS: 100.0000 mg | ORAL_CAPSULE | Freq: Two times a day (BID) | ORAL | Status: DC | PRN

## 2014-10-30 MED ORDER — OXYCODONE-ACETAMINOPHEN 7.5-325 MG PO TABS: 1.0000 | ORAL_TABLET | ORAL | Status: DC | PRN

## 2014-10-30 MED ORDER — ONDANSETRON HCL 4 MG/2ML IJ SOLN: 4.0000 mg | Freq: Four times a day (QID) | INTRAMUSCULAR | Status: DC | PRN

## 2014-10-30 MED ORDER — HYDROMORPHONE HCL 2 MG/ML IJ SOLN
INTRAMUSCULAR | Status: AC
  Filled 2014-10-30: qty 1

## 2014-10-30 MED ORDER — MENTHOL 3 MG MT LOZG
1.0000 | LOZENGE | OROMUCOSAL | Status: DC | PRN
  Filled 2014-10-30: qty 9

## 2014-10-30 MED ORDER — NEOSTIGMINE METHYLSULFATE 10 MG/10ML IV SOLN
INTRAVENOUS | Status: DC | PRN
  Administered 2014-10-30: 4 mg via INTRAVENOUS

## 2014-10-30 MED ORDER — ALUM & MAG HYDROXIDE-SIMETH 200-200-20 MG/5ML PO SUSP: 30.0000 mL | ORAL | Status: DC | PRN

## 2014-10-30 MED ORDER — ROCURONIUM BROMIDE 100 MG/10ML IV SOLN
INTRAVENOUS | Status: DC | PRN
  Administered 2014-10-30: 5 mg via INTRAVENOUS
  Administered 2014-10-30: 25 mg via INTRAVENOUS

## 2014-10-30 MED ORDER — VITAMIN C 500 MG PO TABS
500.0000 mg | ORAL_TABLET | Freq: Every day | ORAL | Status: DC
  Administered 2014-10-30 – 2014-10-31 (×2): 500 mg via ORAL
  Filled 2014-10-30 (×2): qty 1

## 2014-10-30 MED ORDER — SUCCINYLCHOLINE CHLORIDE 20 MG/ML IJ SOLN
INTRAMUSCULAR | Status: DC | PRN
  Administered 2014-10-30: 100 mg via INTRAVENOUS

## 2014-10-30 SURGICAL SUPPLY — 47 items
BANDAGE ELASTIC 6 VELCRO ST LF (GAUZE/BANDAGES/DRESSINGS) ×2 IMPLANT
BANDAGE ESMARK 6X9 LF (GAUZE/BANDAGES/DRESSINGS) ×1 IMPLANT
BIT DRILL 2.8X128 (BIT) ×2 IMPLANT
BIT DRILL 2.8X128MM (BIT) ×1
BNDG CMPR 9X6 STRL LF SNTH (GAUZE/BANDAGES/DRESSINGS) ×1
BNDG ESMARK 6X9 LF (GAUZE/BANDAGES/DRESSINGS) ×3
CLOTH 2% CHLOROHEXIDINE 3PK (PERSONAL CARE ITEMS) ×3 IMPLANT
CUFF TOURN SGL QUICK 34 (TOURNIQUET CUFF) ×3
CUFF TRNQT CYL 34X4X40X1 (TOURNIQUET CUFF) ×1 IMPLANT
DRAPE POUCH INSTRU U-SHP 10X18 (DRAPES) ×3 IMPLANT
DRAPE SHEET LG 3/4 BI-LAMINATE (DRAPES) ×2 IMPLANT
DRAPE SPLIT 77X100IN (DRAPES) ×6 IMPLANT
DRAPE U-SHAPE 47X51 STRL (DRAPES) ×3 IMPLANT
DRSG AQUACEL AG ADV 3.5X10 (GAUZE/BANDAGES/DRESSINGS) ×2 IMPLANT
DRSG MEPILEX BORDER 4X8 (GAUZE/BANDAGES/DRESSINGS) ×2 IMPLANT
DURAPREP 26ML APPLICATOR (WOUND CARE) ×3 IMPLANT
ELECT REM PT RETURN 9FT ADLT (ELECTROSURGICAL) ×3
ELECTRODE REM PT RTRN 9FT ADLT (ELECTROSURGICAL) ×1 IMPLANT
GAUZE XEROFORM 5X9 LF (GAUZE/BANDAGES/DRESSINGS) ×2 IMPLANT
GLOVE BIOGEL PI IND STRL 7.5 (GLOVE) ×1 IMPLANT
GLOVE BIOGEL PI IND STRL 8 (GLOVE) ×1 IMPLANT
GLOVE BIOGEL PI INDICATOR 7.5 (GLOVE) ×4
GLOVE BIOGEL PI INDICATOR 8 (GLOVE) ×2
GLOVE SURG SS PI 7.5 STRL IVOR (GLOVE) ×5 IMPLANT
GLOVE SURG SS PI 8.0 STRL IVOR (GLOVE) ×3 IMPLANT
GOWN BRE IMP PREV XXLGXLNG (GOWN DISPOSABLE) ×2 IMPLANT
GOWN STRL REUS W/TWL XL LVL3 (GOWN DISPOSABLE) ×6 IMPLANT
IMMOBILIZER KNEE 20 (SOFTGOODS) ×3
IMMOBILIZER KNEE 20 THIGH 36 (SOFTGOODS) ×1 IMPLANT
KIT BASIN OR (CUSTOM PROCEDURE TRAY) ×3 IMPLANT
MANIFOLD NEPTUNE II (INSTRUMENTS) ×3 IMPLANT
NEEDLE ANCHOR KEITH 2 7/8 STR (NEEDLE) ×2 IMPLANT
NEEDLE HYPO 22GX1.5 SAFETY (NEEDLE) ×3 IMPLANT
PACK TOTAL JOINT (CUSTOM PROCEDURE TRAY) ×3 IMPLANT
POSITIONER SURGICAL ARM (MISCELLANEOUS) ×3 IMPLANT
STAPLER VISISTAT (STAPLE) ×3 IMPLANT
SUT ETHIBOND NAB CT1 #1 30IN (SUTURE) ×2 IMPLANT
SUT FIBERWIRE #2 38 T-5 BLUE (SUTURE) ×3
SUT VIC AB 1 CT1 27 (SUTURE) ×15
SUT VIC AB 1 CT1 27XBRD ANTBC (SUTURE) ×2 IMPLANT
SUT VIC AB 2-0 CT1 27 (SUTURE) ×6
SUT VIC AB 2-0 CT1 TAPERPNT 27 (SUTURE) ×2 IMPLANT
SUTURE FIBERWR #2 38 T-5 BLUE (SUTURE) ×2 IMPLANT
SYRINGE 20CC LL (MISCELLANEOUS) ×3 IMPLANT
TOWEL OR 17X26 10 PK STRL BLUE (TOWEL DISPOSABLE) ×3 IMPLANT
TOWEL OR NON WOVEN STRL DISP B (DISPOSABLE) ×2 IMPLANT
WRAP KNEE MAXI GEL POST OP (GAUZE/BANDAGES/DRESSINGS) ×2 IMPLANT

## 2014-10-30 NOTE — Progress Notes (Signed)
Orthopedic Tech Progress Note Patient Details:  Frank Trujillo [birth year] [id number]   Called in order to [employer]. Patient ID: Frank Trujillo, male   DOB: 1951/10/21, 63 y.o.   MRN: [id number]   [name] 10/30/2014, 4:03 PM

## 2014-10-30 NOTE — Anesthesia Postprocedure Evaluation (Signed)
  Anesthesia Post-op Note  Patient: Frank Trujillo  Procedure(s) Performed: Procedure(s) (LRB): REPAIR QUADRICEP TENDON, EVACUATION HEMATOMA  LEFT KNEE (Left)  Patient Location: PACU  Anesthesia Type: General  Level of Consciousness: awake and alert   Airway and Oxygen Therapy: Patient Spontanous Breathing  Post-op Pain: mild  Post-op Assessment: Post-op Vital signs reviewed, Patient's Cardiovascular Status Stable, Respiratory Function Stable, Patent Airway and No signs of Nausea or vomiting  Last Vitals:  Filed Vitals:   10/30/14 1530  BP: 172/98  Pulse:   Temp:   Resp:     Post-op Vital Signs: stable   Complications: Patient reported feeling "crumy all over with pain in his (operative) leg" but specifically denies any chest pain, dyspnea, arm or back pain, palpitations. Spoke with patients cardiology team, Dr. Marigene Ehlers, and performed another EKG in the PACU and will cycle cardiac enzymes although his impression is that this is a prexisting condition given that he is asymptomatic and EKG is similar to prior. Prior to surgery, the patient had an exercise stress test that was reviewed by his cardiologist and no further cardiac testing was thought to be necessary. If cardiac enzymes are abnormal at this time, cardiology will be informed and come to formally consult on this patient.

## 2014-10-30 NOTE — Transfer of Care (Signed)
Immediate Anesthesia Transfer of Care Note  Patient: Frank Trujillo  Procedure(s) Performed: Procedure(s): REPAIR QUADRICEP TENDON, EVACUATION HEMATOMA  LEFT KNEE (Left)  Patient Location: PACU  Anesthesia Type:General  Level of Consciousness: awake, alert  and oriented  Airway & Oxygen Therapy: Patient Spontanous Breathing and Patient connected to face mask oxygen  Post-op Assessment: Report given to PACU RN and Post -op Vital signs reviewed and stable  Post vital signs: Reviewed and stable  Complications: No apparent anesthesia complications

## 2014-10-30 NOTE — Brief Op Note (Signed)
10/30/2014  2:27 PM  PATIENT:  Frank Trujillo  63 y.o. male  PRE-OPERATIVE DIAGNOSIS:  left quad tendon tear  POST-OPERATIVE DIAGNOSIS:  left quad tendon tear  PROCEDURE:  Procedure(s): REPAIR QUADRICEP TENDON LEFT  (Left)  SURGEON:  Surgeon(s) and Role:    * Johnn Hai, MD - Primary  PHYSICIAN ASSISTANT:   ASSISTANTS: Bissell   ANESTHESIA:   general  EBL:  Total I/O In: 1000 [I.V.:1000] Out: -   BLOOD ADMINISTERED:none  DRAINS: none   LOCAL MEDICATIONS USED:  MARCAINE     SPECIMEN:  No Specimen  DISPOSITION OF SPECIMEN:  N/A  COUNTS:  YES  TOURNIQUET:   Total Tourniquet Time Documented: Thigh (Left) - 44 minutes Total: Thigh (Left) - 44 minutes   DICTATION: .Other Dictation: Dictation Number [id number]  PLAN OF CARE: Admit for overnight observation  PATIENT DISPOSITION:  PACU - hemodynamically stable.   Delay start of Pharmacological VTE agent (>24hrs) due to surgical blood loss or risk of bleeding: no
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
## 2014-10-30 NOTE — Interval H&P Note (Signed)
History and Physical Interval Note:  10/30/2014 12:54 PM  Frank Trujillo  has presented today for surgery, with the diagnosis of left quad tendon tear  The various methods of treatment have been discussed with the patient and family. After consideration of risks, benefits and other options for treatment, the patient has consented to  Procedure(s): REPAIR QUADRICEP TENDON LEFT  (Left) as a surgical intervention .  The patient's history has been reviewed, patient examined, no change in status, stable for surgery.  I have reviewed the patient's chart and labs.  Questions were answered to the patient's satisfaction.     Jasleen Riepe C
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
## 2014-10-31 NOTE — Progress Notes (Signed)
Discharge from floor via w/c, family with pt. No changes in assessment. Alexyss Balzarini, [employer]

## 2014-10-31 NOTE — Discharge Instructions (Signed)
Ice and elevate L leg, toes above the nose, 5-6x/day, 20-30 minutes each, to reduce swelling Keep bandages and incision clean and dry Remain in bledsoe brace at all times. May weightbear on heel only in bledsoe brace Resume xarelto 20mg  daily to prevent blood clots Follow up with Dr. Tonita Cong in 1 week in the office for dressing change  Call if wound has more than a small amount of drainage from wound or any amount that is NOT small amounts of blood or clear fluid (may be pink-, yellow-, or orange-tinged).
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
## 2014-10-31 NOTE — Progress Notes (Addendum)
Subjective: 1 Day Post-Op Procedure(s) (LRB): REPAIR QUADRICEP TENDON, EVACUATION HEMATOMA  LEFT KNEE (Left) Patient reports pain as well controlled. Tolerating brace. Tolerating PO's well. Denies SOB,or CP. No calf pain. No N/V or F/C.   Objective: Vital signs in last 24 hours: Temp:  [96.8 F (36 C)-98.9 F (37.2 C)] 98.9 F (37.2 C) (11/14 0547) Pulse Rate:  [76-105] 85 (11/14 0547) Resp:  [9-18] 16 (11/14 0547) BP: (129-179)/(71-107) 129/71 mmHg (11/14 0547) SpO2:  [96 %-100 %] 98 % (11/14 0547) Weight:  [115.214 kg (254 lb)] 115.214 kg (254 lb) (11/13 1117)  Intake/Output from previous day: 11/13 0701 - 11/14 0700 In: 3651.3 [P.O.:240; I.V.:3311.3; IV Piggyback:100] Out: 1800 [Urine:1650; Blood:150] Intake/Output this shift:    No results for input(s): HGB in the last 72 hours. No results for input(s): WBC, RBC, HCT, PLT in the last 72 hours. No results for input(s): NA, K, CL, CO2, BUN, CREATININE, GLUCOSE, CALCIUM in the last 72 hours. No results for input(s): LABPT, INR in the last 72 hours.  Alert and oriented x3. RRR, Lungs clear, BS x4. Left Calf soft and non tender. Brace in place. L knee dressing C/D/I. No DVT signs. No signs of infection or compartment syndrome. LLE neurovascularly intact.   Assessment/Plan: 1 Day Post-Op Procedure(s) (LRB): REPAIR QUADRICEP TENDON, EVACUATION HEMATOMA  LEFT KNEE (Left) D/C home today Follow up in office with Dr. Tonita Cong. Follow instructions Weight bear on Heel and in brace Restart Xarelto today  Frank Trujillo L 10/31/2014, 8:17 AM

## 2014-10-31 NOTE — Plan of Care (Signed)
Problem: Phase III Progression Outcomes Goal: Pain controlled on oral analgesia Outcome: Completed/Met Date Met:  10/31/14 Goal: Activity at appropriate level-compared to baseline (UP IN CHAIR FOR HEMODIALYSIS) Outcome: Completed/Met Date Met:  10/31/14 Goal: Voiding independently Outcome: Completed/Met Date Met:  10/31/14 Goal: IV changed to normal saline lock Outcome: Completed/Met Date Met:  10/31/14 Goal: Nasogastric tube discontinued Outcome: Not Applicable Date Met:  63/87/56 Goal: Discharge plan remains appropriate-arrangements made Outcome: Completed/Met Date Met:  10/31/14 Goal: Demonstrates TCDB, IS independently Outcome: Completed/Met Date Met:  10/31/14 Goal: Other Phase III Outcomes/Goals Outcome: Not Applicable Date Met:  43/32/95  Problem: Discharge Progression Outcomes Goal: Barriers To Progression Addressed/Resolved Outcome: Not Applicable Date Met:  18/84/16 Goal: Discharge plan in place and appropriate Outcome: Completed/Met Date Met:  10/31/14 Goal: Pain controlled with appropriate interventions Outcome: Completed/Met Date Met:  10/31/14 Goal: Hemodynamically stable Outcome: Completed/Met Date Met:  60/63/01 Goal: Complications resolved/controlled Outcome: Not Applicable Date Met:  60/10/93 Goal: Tolerating diet Outcome: Completed/Met Date Met:  10/31/14 Goal: Activity appropriate for discharge plan Outcome: Completed/Met Date Met:  10/31/14 Goal: Tubes and drains discontinued if indicated Outcome: Completed/Met Date Met:  10/31/14 Goal: Staples/sutures removed Outcome: Not Applicable Date Met:  23/55/73 Goal: Steri-Strips applied Outcome: Not Applicable Date Met:  22/02/54 Goal: Other Discharge Outcomes/Goals Outcome: Not Applicable Date Met:  27/06/23
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
## 2014-10-31 NOTE — Op Note (Signed)
NAME:  Frank Trujillo, Frank Trujillo NO.:  [id number]  MEDICAL RECORD NO.:  [id number]  LOCATION:  4782                         FACILITY:  [facility]  PHYSICIAN:  Susa Day, M.D.    DATE OF BIRTH:  10/31/1951  DATE OF PROCEDURE:  10/30/2014 DATE OF DISCHARGE:                              OPERATIVE REPORT   PREOPERATIVE DIAGNOSES: 1. Quadriceps tendon tear. 2. Abrasion, left proximal tibia. 3. Hemarthrosis.  POSTOPERATIVE DIAGNOSES: 1. Quadriceps tendon tear. 2. Abrasion, left proximal tibia. 3. Hemarthrosis.  PROCEDURES PERFORMED: 1. Dressing change of left proximal tibial abrasion. 2. Open quadriceps tendon repair. 3. Evacuation of knee hemarthrosis.  ANESTHESIA:  General.  ASSISTANT:  Cleophas Dunker, PA  HISTORY:  A 63 year old with quad tendon tear and abrasion.  Tear was at the musculotendinous junction of the quadriceps tendon.  He had an abrasion to the midportion of the patella, was treated with antibiotics, local debridement of the office until good granulation tissue and then we felt that proceeding with repairing of the quad tendon would be appropriate.  Risks and benefits were discussed including bleeding, infection, arthrofibrosis, re-tear, suboptimal range of motion, DVT, PE, anesthetic complications, etc.  TECHNIQUE:  With the patient in supine position, after induction of adequate general anesthesia, 2 g of Kefzol, left lower extremity was prepped and draped in usual sterile fashion.  The area of the abrasion then extended from the midtibia to the midportion of the patella, had good granulation tissue.  We used a DuraPrep to the periphery of that. We had cleaned it with CHG, cleansing prior to that, the entire extremity was cleansed that way.  We then placed a sterile Mepilex over that abrasion.  Then, we prepped and draped the extremity in the usual sterile fashion.  Thigh tourniquet inflated to 250 mmHg after elevation of the extremity.  We  made an incision from the superior pole of the patella away from the abrasion, approximately 10 cm in length. Subcutaneous tissue was dissected.  Electrocautery was utilized to achieve hemostasis.  We incised that to the superior pole of the patella through the musculotendinous junction.  There was tearing of the tendon noted predominantly laterally at a portion above the insertion of the quadriceps attachment to the patella.  There was some complex tearing of the lateral aspect of the tendon as well as musculotendinous junction. Through that, we evacuated a large hemarthrosis and copiously irrigated the knee and the zone of injury with antibiotic irrigation.  Following that, we proceeded with repair in the lateral fat aspect of the tendon and in the lateral aspect of the central portion of the central sleeve, this was repair with #1 Ethibond, #1 Vicryl interrupted figure-of-eight sutures.  Up and through the musculotendinous junction into the intermedius and vastus lateralis tear into the musculature, this was loosely approximated with #1 Vicryl.  The attachment of the quadriceps tendon to the patella was intact.  Lateralis was torn just proximal to this and it was then sewed to the central tendon with #1 Ethibond. After this, there was full closure of the tear within the muscle and copiously irrigated the wound.  We then closed the wound with 2-0 and then with staples.  We then placed  a AquaFix dressing on the thigh incision and we covered it.  Then, we removed the dressing over the abrasion, re-dressed that with Xeroform and then a Mepilex.  We were able to quarantine the wound throughout the case.  This was then secured with an Ace bandage.  Tourniquet was deflated and there was adequate revascularization of lower extremity appreciated.  The patient tolerated the procedure well.  No complications.  Assistant, Cleophas Dunker, PA was used for leg holding, closure and  exposure. Minimal blood loss.  Tourniquet time 45 minutes.     Susa Day, M.D.     Geralynn Rile  D:  10/30/2014  T:  10/31/2014  Job:  [id number]
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
## 2014-11-02 ENCOUNTER — Telehealth: Payer: Self-pay | Admitting: Internal Medicine

## 2014-11-02 ENCOUNTER — Encounter (HOSPITAL_COMMUNITY): Payer: Self-pay | Admitting: Specialist
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
## 2014-11-02 NOTE — Telephone Encounter (Signed)
New Message        Pt calling in regards to [place]. Please call pt back and advise.

## 2014-11-03 ENCOUNTER — Ambulatory Visit (HOSPITAL_COMMUNITY): Payer: Managed Care, Other (non HMO) | Admitting: Nurse Practitioner

## 2014-11-19 ENCOUNTER — Telehealth: Payer: Self-pay | Admitting: Cardiology

## 2014-11-19 NOTE — Telephone Encounter (Signed)
Rx was given by Dr. Rayann Heman but pt had not had it filled due to having samples on hand and then needing to discontinue meds for surgery 11/13. Pt needed clarification about whether to resume Xarelto. Reviewed notes and instructions were given prior to surgery to resume the medication following the surgery. Pt informed that Rx verified to be on file at his pharmacy. Pt states will resume and schedule F/U with Dr. Rayann Heman; did not state any other needs.

## 2014-11-19 NOTE — Telephone Encounter (Signed)
Pt been off of his Xarelto,wants to know when he is suppose to start back taking it. He was off of it because he had knee surgery on 10-30-14.

## 2015-02-15 ENCOUNTER — Telehealth: Payer: Self-pay | Admitting: Cardiology

## 2015-02-15 ENCOUNTER — Telehealth: Payer: Self-pay | Admitting: Internal Medicine
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
## 2015-02-15 NOTE — Telephone Encounter (Signed)
New message     Pt want to know when he is to see Dr Rayann Heman again?

## 2015-02-15 NOTE — Telephone Encounter (Signed)
Was due in 3 weeks from 10/04/14 with Orson Eva, NP and 2 months with Dr Rayann Heman.  I have asked he call back and schedule an appointment with Butch Penny this week

## 2015-02-15 NOTE — Telephone Encounter (Signed)
Returned call to patient to inform him that the referral for Afib was to Dr. Thompson Grayer - 1 appointment took place in Oct 2015. Provided him with [employer] office number. No further assistance necessary.

## 2015-02-19 ENCOUNTER — Encounter: Payer: Self-pay | Admitting: Nurse Practitioner

## 2015-02-19 ENCOUNTER — Telehealth: Payer: Self-pay | Admitting: *Deleted

## 2015-02-19 ENCOUNTER — Ambulatory Visit (INDEPENDENT_AMBULATORY_CARE_PROVIDER_SITE_OTHER): Payer: Managed Care, Other (non HMO) | Admitting: Nurse Practitioner

## 2015-02-19 VITALS — BP 140/82 | HR 79 | Ht 74.0 in | Wt 263.8 lb

## 2015-02-19 DIAGNOSIS — I4819 Other persistent atrial fibrillation: Secondary | ICD-10-CM

## 2015-02-19 DIAGNOSIS — I481 Persistent atrial fibrillation: Secondary | ICD-10-CM

## 2015-02-19 MED ORDER — RIVAROXABAN 20 MG PO TABS: 20.0000 mg | ORAL_TABLET | Freq: Every day | ORAL | Status: DC

## 2015-02-19 NOTE — Patient Instructions (Signed)
Your physician has recommended you make the following change in your medication:  1)start xarelto  20mg  with supper  Your physician recommends that you schedule a follow-up appointment in: 3 weeks with Roderic Palau, NP  Butch Penny will call you on Monday after reviewing chart with Dr. Rayann Heman
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
## 2015-02-19 NOTE — Telephone Encounter (Signed)
Called dr. Gus Height office regarding recent lab work performed in their office - requested to be faxed - talked to

## 2015-02-22 ENCOUNTER — Telehealth (HOSPITAL_COMMUNITY): Payer: Self-pay | Admitting: Nurse Practitioner

## 2015-02-22 NOTE — Telephone Encounter (Signed)
Spoke with kristi - will fax labs over

## 2015-02-22 NOTE — Telephone Encounter (Signed)
Discussed with Dr. Rayann Heman re borderline stress test with plans to start flecainide. He suggested that a myoview be ordered after he obtains SR. Pt made aware. LMOM.

## 2015-02-23 ENCOUNTER — Encounter: Payer: Self-pay | Admitting: Nurse Practitioner

## 2015-03-08 ENCOUNTER — Ambulatory Visit: Payer: Managed Care, Other (non HMO) | Admitting: Nurse Practitioner

## 2015-03-09 ENCOUNTER — Encounter: Payer: Self-pay | Admitting: Nurse Practitioner

## 2015-03-09 ENCOUNTER — Ambulatory Visit (INDEPENDENT_AMBULATORY_CARE_PROVIDER_SITE_OTHER): Payer: Managed Care, Other (non HMO) | Admitting: Nurse Practitioner

## 2015-03-09 VITALS — BP 140/88 | HR 87 | Ht 74.0 in | Wt 259.0 lb

## 2015-03-09 DIAGNOSIS — I481 Persistent atrial fibrillation: Secondary | ICD-10-CM

## 2015-03-09 DIAGNOSIS — I4819 Other persistent atrial fibrillation: Secondary | ICD-10-CM

## 2015-03-09 MED ORDER — FLECAINIDE ACETATE 100 MG PO TABS: 100.0000 mg | ORAL_TABLET | Freq: Two times a day (BID) | ORAL | Status: DC
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
## 2015-03-09 NOTE — Patient Instructions (Signed)
Your physician recommends that you schedule a follow-up appointment on April 5th at [facility] with Roderic Palau, NP for EKG  Afib clinic is located in [facility] off of [address] at hospital.  Code for parking garage is [id number]  Your physician has recommended you make the following change in your medication:  1)Start Flecainide 100mg  twice daily on Tuesday, 03/16/2015

## 2015-03-22 ENCOUNTER — Telehealth (HOSPITAL_COMMUNITY): Payer: Self-pay | Admitting: Nurse Practitioner

## 2015-03-22 NOTE — Telephone Encounter (Signed)
Pt cxl appt for 03-23-15 but wants to talk to Butch Penny or Stacy--has questions

## 2015-03-23 ENCOUNTER — Ambulatory Visit (HOSPITAL_COMMUNITY): Payer: Managed Care, Other (non HMO) | Admitting: Nurse Practitioner

## 2015-03-23 NOTE — Telephone Encounter (Addendum)
Left message for patient to call back  Patient has head cold and wants to wait til feeling better before rescheduling appointment. Patient did start taking the flecainide; will come in later this week hopefully to have ekg performed.

## 2015-03-26 ENCOUNTER — Other Ambulatory Visit: Payer: Self-pay

## 2015-03-26 ENCOUNTER — Ambulatory Visit (HOSPITAL_COMMUNITY)
Admission: RE | Admit: 2015-03-26 | Discharge: 2015-03-26 | Disposition: A | Discharge status: Home / Self Care | Payer: Managed Care, Other (non HMO) | Source: Ambulatory Visit | Attending: Nurse Practitioner | Admitting: Nurse Practitioner

## 2015-03-26 ENCOUNTER — Encounter (HOSPITAL_COMMUNITY): Payer: Self-pay | Admitting: Nurse Practitioner

## 2015-03-26 DIAGNOSIS — R9431 Abnormal electrocardiogram [ECG] [EKG]: Secondary | ICD-10-CM | POA: Insufficient documentation

## 2015-03-26 DIAGNOSIS — I4891 Unspecified atrial fibrillation: Secondary | ICD-10-CM | POA: Insufficient documentation

## 2015-03-26 DIAGNOSIS — I4892 Unspecified atrial flutter: Secondary | ICD-10-CM | POA: Insufficient documentation

## 2015-03-26 MED ORDER — METOPROLOL SUCCINATE ER 25 MG PO TB24: 12.5000 mg | ORAL_TABLET | Freq: Every day | ORAL | Status: DC

## 2015-03-26 MED ORDER — METOPROLOL TARTRATE 25 MG PO TABS: 12.5000 mg | ORAL_TABLET | Freq: Every day | ORAL | Status: DC

## 2015-03-26 NOTE — Patient Instructions (Signed)
Your physician has recommended you make the following change in your medication:  1)metoprolol (toprol) 12.5mg  once daily  Call and let us know what you decide [phone]

## 2015-04-06 ENCOUNTER — Telehealth (HOSPITAL_COMMUNITY): Payer: Self-pay | Admitting: *Deleted

## 2015-04-06 NOTE — Telephone Encounter (Addendum)
Left message for pt to call back regarding decision of either cardioversion or coming off the flecainide and following up with his primary cardiologist (hochrein). Will await a call back from pt.

## 2015-04-16 NOTE — Telephone Encounter (Signed)
Follow up   ° ° ° °Patient returning call back to nurse from last week   °

## 2015-04-16 NOTE — Telephone Encounter (Signed)
Follow up     Forwarding message over to [employer]

## 2015-04-16 NOTE — Telephone Encounter (Signed)
Juluis Mire, RN, has already addressed and completed this encounter (see below)

## 2015-04-16 NOTE — Telephone Encounter (Signed)
Talked with patient he has stopped the flecainide - he was just very hesitant to have another cardioversion since his last cardioversion did not work. He says he is not sure what option will be next for him -- told him that Roderic Palau, NP wanted him to follow up with Dr. Percival Spanish since he has decided to stop the flecainide. Patient is agreeable to this -- appt was made for June 11th with Dr. Percival Spanish.

## 2015-05-31 ENCOUNTER — Ambulatory Visit (INDEPENDENT_AMBULATORY_CARE_PROVIDER_SITE_OTHER): Payer: Managed Care, Other (non HMO) | Admitting: Cardiology

## 2015-05-31 ENCOUNTER — Encounter: Payer: Self-pay | Admitting: Cardiology

## 2015-05-31 VITALS — BP 128/90 | HR 72 | Ht 74.0 in | Wt 256.0 lb

## 2015-05-31 DIAGNOSIS — I4819 Other persistent atrial fibrillation: Secondary | ICD-10-CM

## 2015-05-31 DIAGNOSIS — I481 Persistent atrial fibrillation: Secondary | ICD-10-CM | POA: Diagnosis not present

## 2015-05-31 MED ORDER — METOPROLOL SUCCINATE ER 25 MG PO TB24: 12.5000 mg | ORAL_TABLET | Freq: Two times a day (BID) | ORAL | Status: DC

## 2015-05-31 NOTE — Patient Instructions (Signed)
Your physician wants you to follow-up in: 6 Months You will receive a reminder letter in the mail two months in advance. If you don't receive a letter, please call our office to schedule the follow-up appointment.  Your physician has recommended you make the following change in your medication: Take Metoprolol 12.5 mg twice a day
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
## 2015-11-23 DIAGNOSIS — E119 Type 2 diabetes mellitus without complications: Secondary | ICD-10-CM | POA: Diagnosis not present

## 2015-12-29 DIAGNOSIS — Z1283 Encounter for screening for malignant neoplasm of skin: Secondary | ICD-10-CM | POA: Diagnosis not present

## 2016-02-07 ENCOUNTER — Other Ambulatory Visit: Payer: Self-pay | Admitting: Family Medicine

## 2016-02-07 DIAGNOSIS — Z23 Encounter for immunization: Secondary | ICD-10-CM | POA: Diagnosis not present

## 2016-02-07 DIAGNOSIS — N529 Male erectile dysfunction, unspecified: Secondary | ICD-10-CM | POA: Diagnosis not present

## 2016-02-07 DIAGNOSIS — Z139 Encounter for screening, unspecified: Secondary | ICD-10-CM

## 2016-02-07 DIAGNOSIS — J45909 Unspecified asthma, uncomplicated: Secondary | ICD-10-CM | POA: Diagnosis not present

## 2016-02-07 DIAGNOSIS — Z0000 Encounter for general adult medical examination without abnormal findings: Secondary | ICD-10-CM | POA: Diagnosis not present

## 2016-02-07 DIAGNOSIS — E119 Type 2 diabetes mellitus without complications: Secondary | ICD-10-CM | POA: Diagnosis not present

## 2016-02-07 DIAGNOSIS — Z125 Encounter for screening for malignant neoplasm of prostate: Secondary | ICD-10-CM | POA: Diagnosis not present

## 2016-02-07 DIAGNOSIS — I482 Chronic atrial fibrillation: Secondary | ICD-10-CM | POA: Diagnosis not present

## 2016-02-17 ENCOUNTER — Ambulatory Visit
Admission: RE | Admit: 2016-02-17 | Discharge: 2016-02-17 | Disposition: A | Discharge status: Home / Self Care | Payer: Medicare Other | Source: Ambulatory Visit | Attending: Family Medicine | Admitting: Family Medicine

## 2016-02-17 DIAGNOSIS — Z87891 Personal history of nicotine dependence: Secondary | ICD-10-CM | POA: Diagnosis not present

## 2016-02-17 DIAGNOSIS — Z139 Encounter for screening, unspecified: Secondary | ICD-10-CM

## 2016-02-17 DIAGNOSIS — Z136 Encounter for screening for cardiovascular disorders: Secondary | ICD-10-CM | POA: Diagnosis not present

## 2016-03-02 DIAGNOSIS — Z7901 Long term (current) use of anticoagulants: Secondary | ICD-10-CM | POA: Diagnosis not present

## 2016-03-02 DIAGNOSIS — Z1211 Encounter for screening for malignant neoplasm of colon: Secondary | ICD-10-CM | POA: Diagnosis not present

## 2016-03-07 ENCOUNTER — Telehealth: Payer: Self-pay | Admitting: *Deleted

## 2016-03-07 NOTE — Telephone Encounter (Signed)
Requesting surgical clearance:   1. Type of surgery: Colonoscopy  2. Surgeon: Dr Paulita Fujita  3. Surgical date: 03/16/2016  4. Medications that need to be help: Xarelto  5. [facility]: Mamie Nick) [phone]  (F) [phone]  Pt saw you last on 05/31/2015, Is pt cleared for surgery, if so how long can his Xarelto be held?

## 2016-03-09 NOTE — Telephone Encounter (Signed)
OK to hold Xarelto two days prior to the colonoscopy.

## 2016-03-14 NOTE — Telephone Encounter (Signed)
Clearance for Surgery was faxed via Epic to [facility] and Dr Paulita Fujita

## 2016-03-16 DIAGNOSIS — D123 Benign neoplasm of transverse colon: Secondary | ICD-10-CM | POA: Diagnosis not present

## 2016-03-16 DIAGNOSIS — K5730 Diverticulosis of large intestine without perforation or abscess without bleeding: Secondary | ICD-10-CM | POA: Diagnosis not present

## 2016-03-16 DIAGNOSIS — Z1211 Encounter for screening for malignant neoplasm of colon: Secondary | ICD-10-CM | POA: Diagnosis not present

## 2016-03-16 DIAGNOSIS — D126 Benign neoplasm of colon, unspecified: Secondary | ICD-10-CM | POA: Diagnosis not present

## 2016-03-24 ENCOUNTER — Encounter: Payer: Self-pay | Admitting: Cardiology

## 2016-03-24 ENCOUNTER — Ambulatory Visit (INDEPENDENT_AMBULATORY_CARE_PROVIDER_SITE_OTHER): Payer: Medicare Other | Admitting: Cardiology

## 2016-03-24 VITALS — Ht 74.0 in | Wt 250.6 lb

## 2016-03-24 DIAGNOSIS — I4819 Other persistent atrial fibrillation: Secondary | ICD-10-CM

## 2016-03-24 DIAGNOSIS — I481 Persistent atrial fibrillation: Secondary | ICD-10-CM
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
## 2016-03-24 NOTE — Patient Instructions (Signed)
Your physician wants you to follow-up in: 1 Year. You will receive a reminder letter in the mail two months in advance. If you don't receive a letter, please call our office to schedule the follow-up appointment.  

## 2016-04-13 DIAGNOSIS — R0602 Shortness of breath: Secondary | ICD-10-CM | POA: Diagnosis not present

## 2016-04-13 DIAGNOSIS — J45909 Unspecified asthma, uncomplicated: Secondary | ICD-10-CM | POA: Diagnosis not present

## 2016-08-14 DIAGNOSIS — Z7984 Long term (current) use of oral hypoglycemic drugs: Secondary | ICD-10-CM | POA: Diagnosis not present

## 2016-08-14 DIAGNOSIS — J45909 Unspecified asthma, uncomplicated: Secondary | ICD-10-CM | POA: Diagnosis not present

## 2016-08-14 DIAGNOSIS — E119 Type 2 diabetes mellitus without complications: Secondary | ICD-10-CM | POA: Diagnosis not present

## 2016-08-14 IMAGING — US US AORTA
1 series · 9 of 9 positions shown · non-contrast
Comparison: None.

CLINICAL DATA: Former smoker.  Abdominal aortic aneurysm screening.

EXAM:
ULTRASOUND OF ABDOMINAL AORTA
TECHNIQUE: Ultrasound examination of the abdominal aorta was performed to
evaluate for abdominal aortic aneurysm.

[Series 1: us aorta · 0.33mm/px · 9 of 9 slices shown]
[im 1/9]
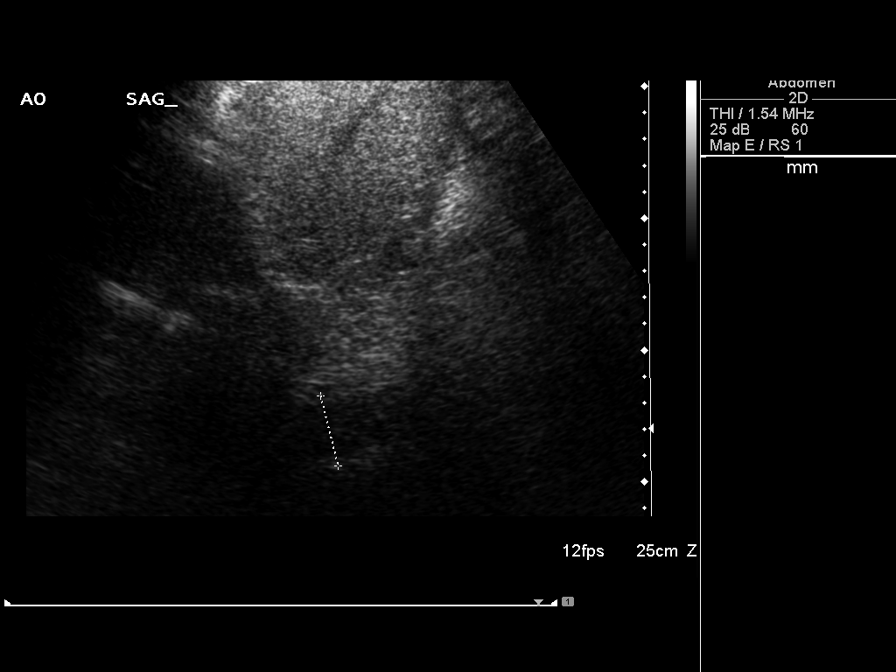
[im 2/9]
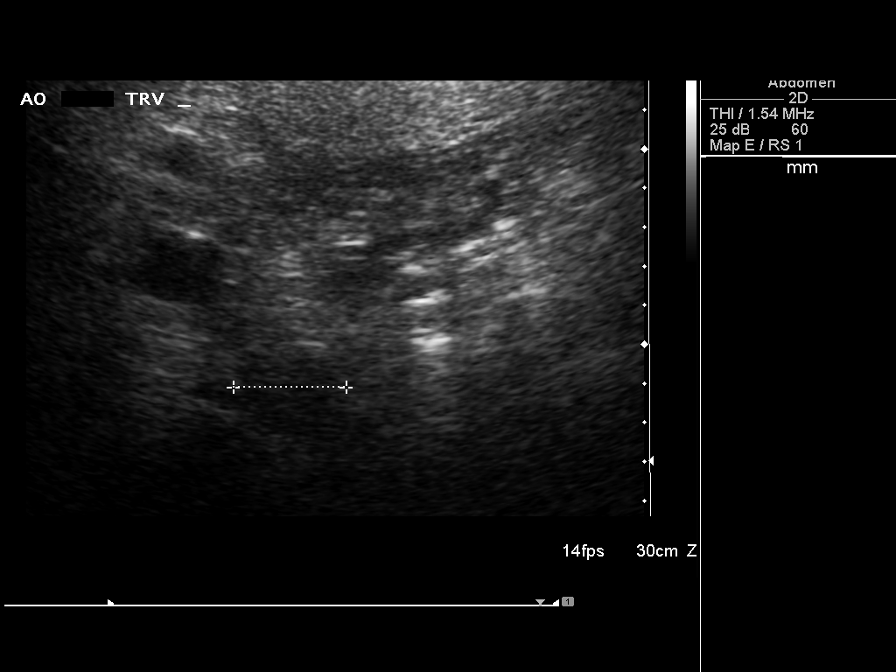
[im 3/9]
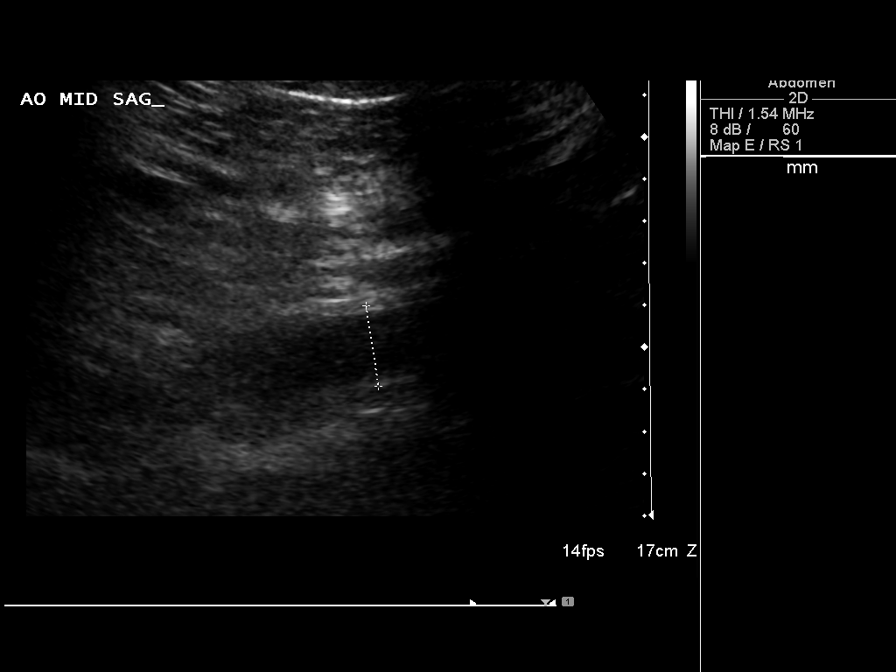
[im 4/9]
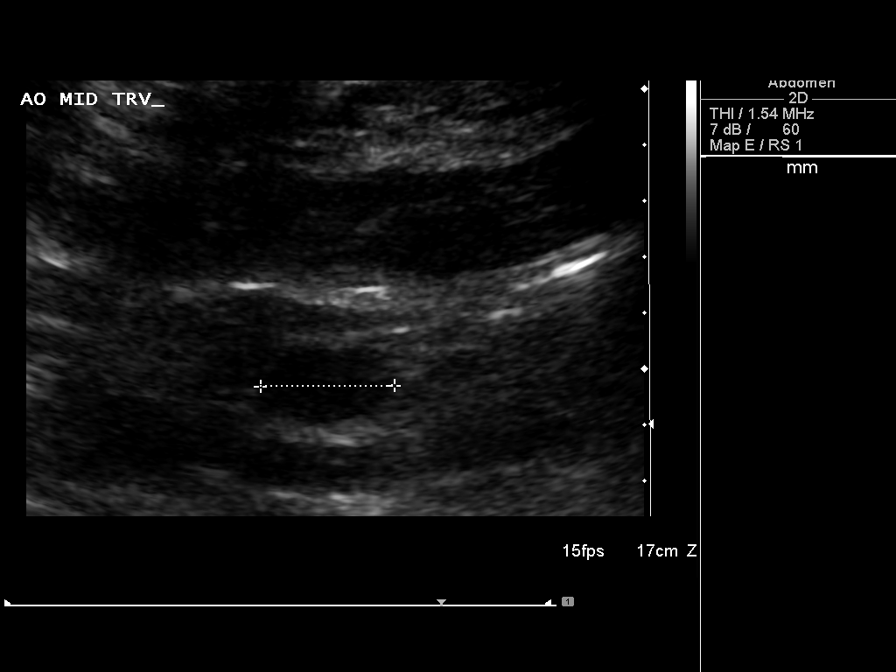
[im 5/9]
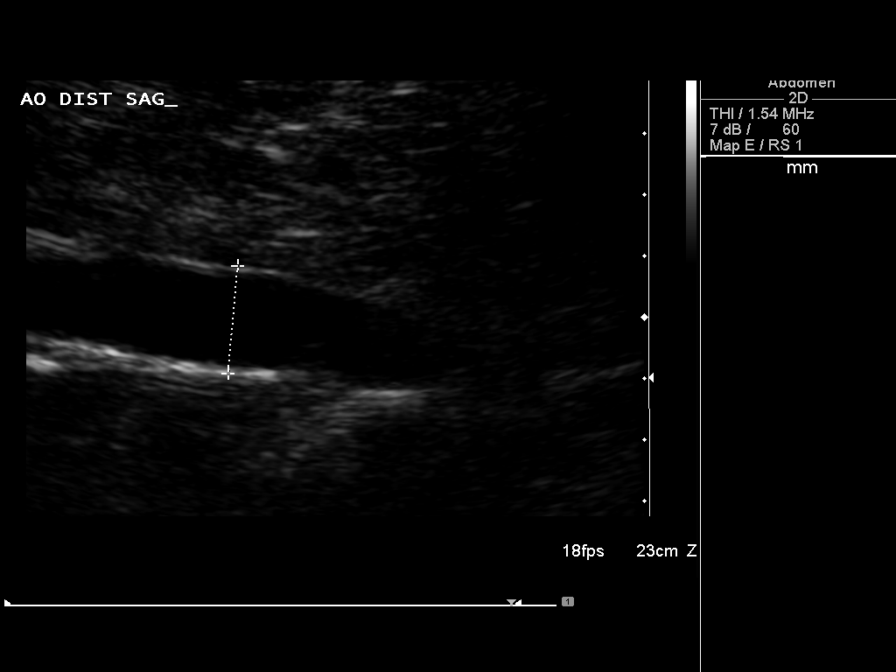
[im 6/9]
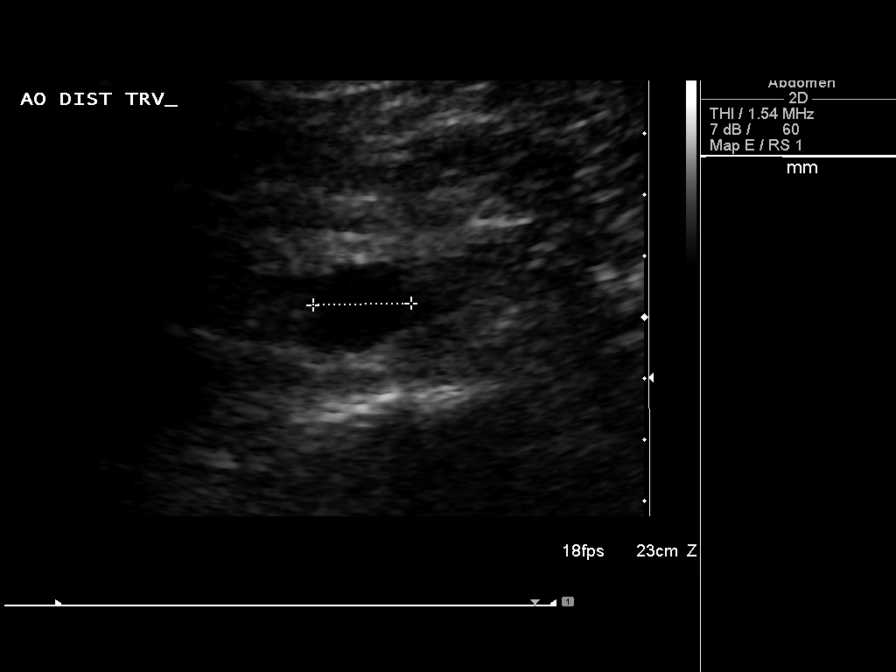
[im 7/9]
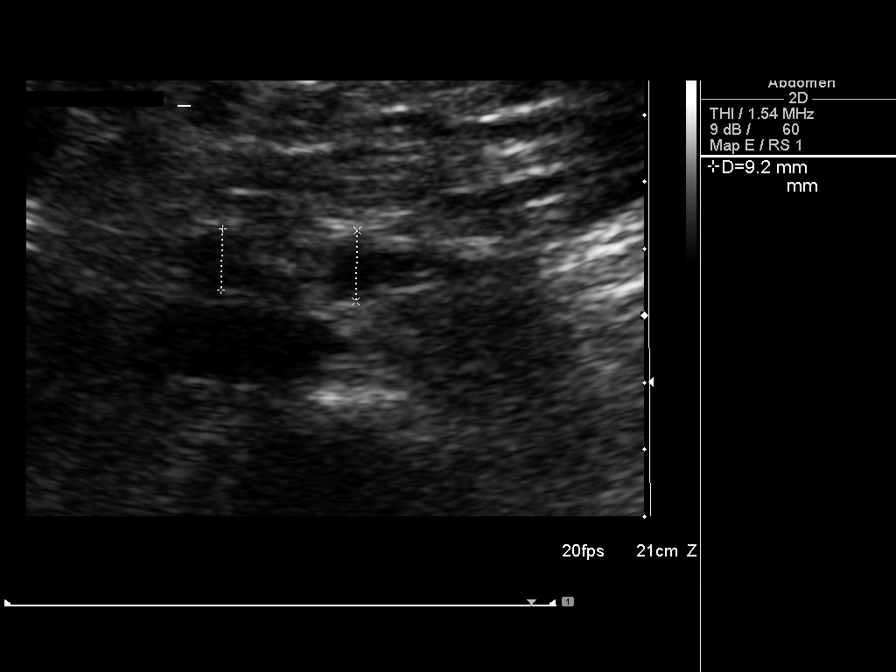
[im 8/9]
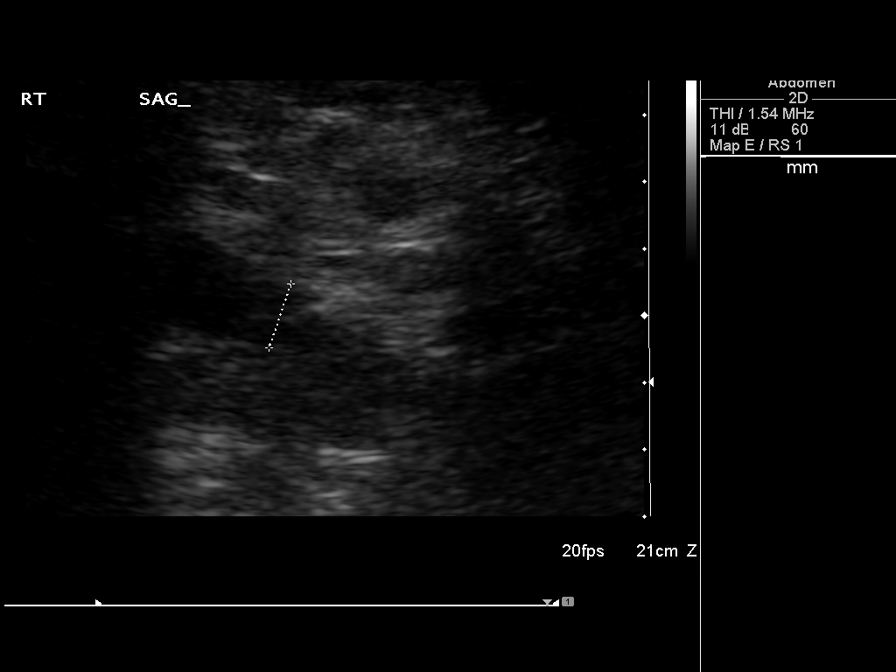
[im 9/9]
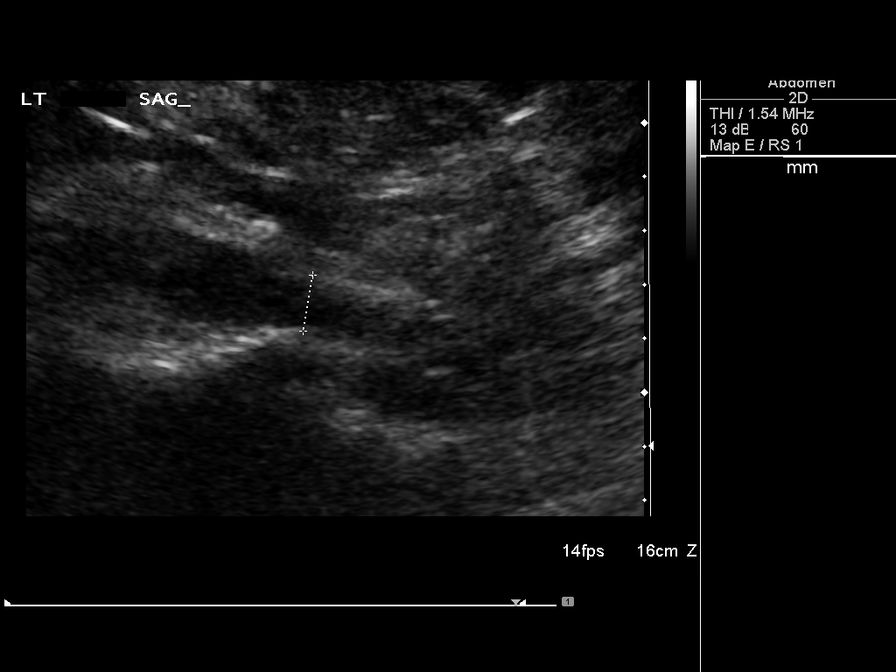

[9 of 9 positions shown; findings below may reference images not displayed]

FINDINGS: Abdominal Aorta

No aneurysm identified.

Maximum Diameter: 2.8 cm
IMPRESSION: No abdominal aortic aneurysm.

## 2016-09-20 DIAGNOSIS — Z23 Encounter for immunization: Secondary | ICD-10-CM | POA: Diagnosis not present

## 2017-02-28 DIAGNOSIS — I482 Chronic atrial fibrillation: Secondary | ICD-10-CM | POA: Diagnosis not present

## 2017-02-28 DIAGNOSIS — Z0000 Encounter for general adult medical examination without abnormal findings: Secondary | ICD-10-CM | POA: Diagnosis not present

## 2017-02-28 DIAGNOSIS — Z23 Encounter for immunization: Secondary | ICD-10-CM | POA: Diagnosis not present

## 2017-02-28 DIAGNOSIS — Z7984 Long term (current) use of oral hypoglycemic drugs: Secondary | ICD-10-CM | POA: Diagnosis not present

## 2017-02-28 DIAGNOSIS — E1140 Type 2 diabetes mellitus with diabetic neuropathy, unspecified: Secondary | ICD-10-CM | POA: Diagnosis not present

## 2017-02-28 DIAGNOSIS — E7800 Pure hypercholesterolemia, unspecified: Secondary | ICD-10-CM | POA: Diagnosis not present

## 2017-02-28 DIAGNOSIS — J45909 Unspecified asthma, uncomplicated: Secondary | ICD-10-CM | POA: Diagnosis not present

## 2017-02-28 DIAGNOSIS — N529 Male erectile dysfunction, unspecified: Secondary | ICD-10-CM | POA: Diagnosis not present
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
## 2017-04-03 ENCOUNTER — Ambulatory Visit (INDEPENDENT_AMBULATORY_CARE_PROVIDER_SITE_OTHER): Payer: Medicare Other | Admitting: Cardiology

## 2017-04-03 ENCOUNTER — Encounter: Payer: Self-pay | Admitting: Cardiology

## 2017-04-03 VITALS — BP 168/88 | HR 83 | Ht 74.0 in | Wt 251.0 lb

## 2017-04-03 DIAGNOSIS — I10 Essential (primary) hypertension: Secondary | ICD-10-CM

## 2017-04-03 DIAGNOSIS — I481 Persistent atrial fibrillation: Secondary | ICD-10-CM | POA: Diagnosis not present

## 2017-04-03 DIAGNOSIS — I4819 Other persistent atrial fibrillation: Secondary | ICD-10-CM

## 2017-04-03 NOTE — Patient Instructions (Signed)

## 2017-09-04 DIAGNOSIS — Z7984 Long term (current) use of oral hypoglycemic drugs: Secondary | ICD-10-CM | POA: Diagnosis not present

## 2017-09-04 DIAGNOSIS — E1140 Type 2 diabetes mellitus with diabetic neuropathy, unspecified: Secondary | ICD-10-CM | POA: Diagnosis not present

## 2017-09-04 DIAGNOSIS — Z23 Encounter for immunization: Secondary | ICD-10-CM | POA: Diagnosis not present

## 2018-01-22 DIAGNOSIS — J4541 Moderate persistent asthma with (acute) exacerbation: Secondary | ICD-10-CM | POA: Diagnosis not present

## 2018-01-22 DIAGNOSIS — J069 Acute upper respiratory infection, unspecified: Secondary | ICD-10-CM | POA: Diagnosis not present

## 2018-01-22 DIAGNOSIS — B001 Herpesviral vesicular dermatitis: Secondary | ICD-10-CM | POA: Diagnosis not present

## 2018-02-25 DIAGNOSIS — R030 Elevated blood-pressure reading, without diagnosis of hypertension: Secondary | ICD-10-CM | POA: Diagnosis not present

## 2018-02-25 DIAGNOSIS — E1140 Type 2 diabetes mellitus with diabetic neuropathy, unspecified: Secondary | ICD-10-CM | POA: Diagnosis not present

## 2018-02-25 DIAGNOSIS — J069 Acute upper respiratory infection, unspecified: Secondary | ICD-10-CM | POA: Diagnosis not present

## 2018-02-25 DIAGNOSIS — R05 Cough: Secondary | ICD-10-CM | POA: Diagnosis not present

## 2018-02-25 DIAGNOSIS — Z7984 Long term (current) use of oral hypoglycemic drugs: Secondary | ICD-10-CM | POA: Diagnosis not present

## 2018-02-25 DIAGNOSIS — H6123 Impacted cerumen, bilateral: Secondary | ICD-10-CM | POA: Diagnosis not present

## 2018-02-25 DIAGNOSIS — J449 Chronic obstructive pulmonary disease, unspecified: Secondary | ICD-10-CM | POA: Diagnosis not present

## 2018-08-13 DIAGNOSIS — E1140 Type 2 diabetes mellitus with diabetic neuropathy, unspecified: Secondary | ICD-10-CM | POA: Diagnosis not present

## 2018-08-13 DIAGNOSIS — I209 Angina pectoris, unspecified: Secondary | ICD-10-CM | POA: Diagnosis not present

## 2018-08-13 DIAGNOSIS — J45909 Unspecified asthma, uncomplicated: Secondary | ICD-10-CM | POA: Diagnosis not present

## 2018-08-13 DIAGNOSIS — J449 Chronic obstructive pulmonary disease, unspecified: Secondary | ICD-10-CM | POA: Diagnosis not present

## 2018-09-02 DIAGNOSIS — E119 Type 2 diabetes mellitus without complications: Secondary | ICD-10-CM | POA: Diagnosis not present
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
## 2018-10-03 ENCOUNTER — Ambulatory Visit (INDEPENDENT_AMBULATORY_CARE_PROVIDER_SITE_OTHER): Payer: Medicare Other | Admitting: Cardiology

## 2018-10-03 ENCOUNTER — Encounter: Payer: Self-pay | Admitting: Cardiology

## 2018-10-03 VITALS — BP 148/92 | Ht 74.0 in | Wt 249.0 lb

## 2018-10-03 DIAGNOSIS — I200 Unstable angina: Secondary | ICD-10-CM

## 2018-10-03 DIAGNOSIS — Z01812 Encounter for preprocedural laboratory examination: Secondary | ICD-10-CM | POA: Diagnosis not present

## 2018-10-03 DIAGNOSIS — I482 Chronic atrial fibrillation, unspecified: Secondary | ICD-10-CM | POA: Diagnosis not present

## 2018-10-03 DIAGNOSIS — R5383 Other fatigue: Secondary | ICD-10-CM

## 2018-10-03 MED ORDER — NITROGLYCERIN 0.4 MG SL SUBL: 0.4000 mg | SUBLINGUAL_TABLET | SUBLINGUAL | 3 refills | Status: DC | PRN

## 2018-10-03 MED ORDER — METOPROLOL SUCCINATE ER 25 MG PO TB24: 25.0000 mg | ORAL_TABLET | Freq: Every day | ORAL | 6 refills | Status: DC

## 2018-10-03 NOTE — Patient Instructions (Addendum)
Medication Instructions:  START- Metoprolol Succinate 25 mg daily  If you need a refill on your cardiac medications before your next appointment, please call your pharmacy.  Labwork: Pre Op Labs HERE IN OUR OFFICE AT [facility]  Take the provided lab slips with you to the lab for your blood draw.   You will NOT need to fast   If you have labs (blood work) drawn today and your tests are completely normal, you will receive your results only by: Marland Kitchen MyChart Message (if you have MyChart) OR . A paper copy in the mail If you have any lab test that is abnormal or we need to change your treatment, we will call you to review the results.  Testing/Procedures: Your physician has requested that you have a cardiac catheterization. Cardiac catheterization is used to diagnose and/or treat various heart conditions. Doctors may recommend this procedure for a number of different reasons. The most common reason is to evaluate chest pain. Chest pain can be a symptom of coronary artery disease (CAD), and cardiac catheterization can show whether plaque is narrowing or blocking your heart's arteries. This procedure is also used to evaluate the valves, as well as measure the blood flow and oxygen levels in different parts of your heart. For further information please visit [email or web address]. Please follow instruction sheet, as given.  Special Instructions:    [facility] [place] [address] Dept: [phone] Loc: [phone]  Frank Trujillo  10/03/2018  You are scheduled for a Cardiac Catheterization on Tuesday, October 22 with Dr. Peter Trujillo.  1. Please arrive at the [facility] (Main Entrance A) at [facility]: [address] [place], [name] at 8:30 AM (This time is two hours before your procedure to ensure your preparation). Free valet parking service is available.   Special  note: Every effort is made to have your procedure done on time. Please understand that emergencies sometimes delay scheduled procedures.  2. Diet: Do not eat solid foods after midnight.  The patient may have clear liquids until 5am upon the day of the procedure.  3. Labs: You will need to have blood drawn on Friday, October 17 at [facility]  Open: [place] (Lunch 12:30 - 1:30)   Phone: [phone]. You do not need to be fasting.  4. Medication instructions in preparation for your procedure:   Contrast Allergy: No   Stop taking Xarelto (Rivaroxaban) on Sunday, October 20.  Stop Taking PO Diabetes Meds Glucophage (Metformin)on Monday, October 21.  On the morning of your procedure, take your  morning medicines NOT listed above.  You may use sips of water.  5. Plan for one night stay--bring personal belongings. 6. Bring a current list of your medications and current insurance cards. 7. You MUST have a responsible person to drive you home. 8. Someone MUST be with you the first 24 hours after you arrive home or your discharge will be delayed. 9. Please wear clothes that are easy to get on and off and wear slip-on shoes.  Thank you for allowing [place] to care for you!   -- [facility]   Follow-Up: . You will need a follow up appointment in After Cath.     At [facility], you and your health needs are our priority.  As part of our continuing mission to provide you with exceptional heart care, we have  created designated Provider Care Teams.  These Care Teams include your primary Cardiologist (physician) and Advanced Practice Providers (APPs -  Physician Assistants and Nurse Practitioners) who all work together to provide you with the care you need, when you need it.   Thank you for choosing [facility] at [facility]!!

## 2018-10-04 LAB — COMPREHENSIVE METABOLIC PANEL
A/G RATIO: 1.7 (ref 1.2–2.2)
ALK PHOS: 125 IU/L — AB (ref 39–117)
ALT: 39 IU/L (ref 0–44)
AST: 33 IU/L (ref 0–40)
Albumin: 4.6 g/dL (ref 3.6–4.8)
BUN/Creatinine Ratio: 15 (ref 10–24)
BUN: 14 mg/dL (ref 8–27)
Bilirubin Total: 0.6 mg/dL (ref 0.0–1.2)
CHLORIDE: 96 mmol/L (ref 96–106)
CO2: 22 mmol/L (ref 20–29)
Calcium: 9.5 mg/dL (ref 8.6–10.2)
Creatinine, Ser: 0.96 mg/dL (ref 0.76–1.27)
GFR calc Af Amer: 94 mL/min/{1.73_m2} (ref >59)
GFR calc non Af Amer: 81 mL/min/{1.73_m2} (ref >59)
GLUCOSE: 282 mg/dL — AB (ref 65–99)
Globulin, Total: 2.7 g/dL (ref 1.5–4.5)
POTASSIUM: 4.9 mmol/L (ref 3.5–5.2)
Sodium: 137 mmol/L (ref 134–144)
Total Protein: 7.3 g/dL (ref 6.0–8.5)

## 2018-10-04 LAB — CBC
Hematocrit: 48.9 % (ref 37.5–51.0)
Hemoglobin: 17 g/dL (ref 13.0–17.7)
MCH: 33.1 pg — ABNORMAL HIGH (ref 26.6–33.0)
MCHC: 34.8 g/dL (ref 31.5–35.7)
MCV: 95 fL (ref 79–97)
Platelets: 218 10*3/uL (ref 150–450)
RBC: 5.13 x10E6/uL (ref 4.14–5.80)
RDW: 12.1 % — AB (ref 12.3–15.4)
WBC: 10.6 10*3/uL (ref 3.4–10.8)

## 2018-10-04 LAB — TSH: TSH: 2.62 u[IU]/mL (ref 0.450–4.500)

## 2018-10-07 ENCOUNTER — Telehealth: Payer: Self-pay | Admitting: *Deleted

## 2018-10-07 NOTE — Telephone Encounter (Addendum)
Pt contacted pre-catheterization scheduled at [facility] for: Tuesday October 22,2019 10:30 AM Verified arrival time and place: [place] Entrance A at: 8 AM  No solid food after midnight prior to cath, clear liquids until 5 AM day of procedure. Verified no contrast allergy.  Hold: Xarelto-10/06/18 until post procedure. Metformin-AM of procedure and 48 hours post procedure.  Except hold medications AM meds can be  taken pre-cath with sip of water including: ASA 81 mg  Confirm patient has responsible person to drive home post procedure and for 24 hours after you arrive home: yes

## 2018-10-08 ENCOUNTER — Encounter (HOSPITAL_COMMUNITY): Payer: Self-pay | Admitting: Cardiology

## 2018-10-08 ENCOUNTER — Ambulatory Visit (HOSPITAL_COMMUNITY)
Admission: RE | Admit: 2018-10-08 | Discharge: 2018-10-08 | Disposition: A | Discharge status: Home / Self Care | Payer: Medicare Other | Source: Ambulatory Visit | Attending: Cardiology | Admitting: Cardiology

## 2018-10-08 ENCOUNTER — Other Ambulatory Visit: Payer: Self-pay

## 2018-10-08 ENCOUNTER — Encounter (HOSPITAL_COMMUNITY)
Admission: RE | Disposition: A | Discharge status: Home / Self Care | Payer: Self-pay | Source: Ambulatory Visit | Attending: Cardiology

## 2018-10-08 DIAGNOSIS — I10 Essential (primary) hypertension: Secondary | ICD-10-CM | POA: Diagnosis not present

## 2018-10-08 DIAGNOSIS — Z87891 Personal history of nicotine dependence: Secondary | ICD-10-CM | POA: Diagnosis not present

## 2018-10-08 DIAGNOSIS — Z7951 Long term (current) use of inhaled steroids: Secondary | ICD-10-CM | POA: Diagnosis not present

## 2018-10-08 DIAGNOSIS — Z79899 Other long term (current) drug therapy: Secondary | ICD-10-CM | POA: Diagnosis not present

## 2018-10-08 DIAGNOSIS — J45909 Unspecified asthma, uncomplicated: Secondary | ICD-10-CM | POA: Diagnosis not present

## 2018-10-08 DIAGNOSIS — Z87828 Personal history of other (healed) physical injury and trauma: Secondary | ICD-10-CM | POA: Insufficient documentation

## 2018-10-08 DIAGNOSIS — I200 Unstable angina: Secondary | ICD-10-CM | POA: Diagnosis present

## 2018-10-08 DIAGNOSIS — Z7901 Long term (current) use of anticoagulants: Secondary | ICD-10-CM | POA: Diagnosis not present

## 2018-10-08 DIAGNOSIS — I4891 Unspecified atrial fibrillation: Secondary | ICD-10-CM | POA: Diagnosis present

## 2018-10-08 DIAGNOSIS — E119 Type 2 diabetes mellitus without complications: Secondary | ICD-10-CM | POA: Diagnosis not present

## 2018-10-08 DIAGNOSIS — R0789 Other chest pain: Secondary | ICD-10-CM | POA: Insufficient documentation

## 2018-10-08 DIAGNOSIS — R079 Chest pain, unspecified: Secondary | ICD-10-CM

## 2018-10-08 DIAGNOSIS — Z8249 Family history of ischemic heart disease and other diseases of the circulatory system: Secondary | ICD-10-CM | POA: Insufficient documentation

## 2018-10-08 DIAGNOSIS — Z9889 Other specified postprocedural states: Secondary | ICD-10-CM | POA: Diagnosis not present

## 2018-10-08 DIAGNOSIS — Z7984 Long term (current) use of oral hypoglycemic drugs: Secondary | ICD-10-CM | POA: Insufficient documentation

## 2018-10-08 DIAGNOSIS — I25119 Atherosclerotic heart disease of native coronary artery with unspecified angina pectoris: Secondary | ICD-10-CM | POA: Insufficient documentation

## 2018-10-08 HISTORY — PX: LEFT HEART CATH AND CORONARY ANGIOGRAPHY: CATH118249

## 2018-10-08 LAB — GLUCOSE, CAPILLARY: GLUCOSE-CAPILLARY: 236 mg/dL — AB (ref 70–99)

## 2018-10-08 SURGERY — LEFT HEART CATH AND CORONARY ANGIOGRAPHY
Anesthesia: LOCAL

## 2018-10-08 MED ORDER — ONDANSETRON HCL 4 MG/2ML IJ SOLN: 4.0000 mg | Freq: Four times a day (QID) | INTRAMUSCULAR | Status: DC | PRN

## 2018-10-08 MED ORDER — FENTANYL CITRATE (PF) 100 MCG/2ML IJ SOLN
INTRAMUSCULAR | Status: DC | PRN
  Administered 2018-10-08: 25 ug via INTRAVENOUS

## 2018-10-08 MED ORDER — MIDAZOLAM HCL 2 MG/2ML IJ SOLN
INTRAMUSCULAR | Status: DC | PRN
  Administered 2018-10-08: 2 mg via INTRAVENOUS

## 2018-10-08 MED ORDER — RIVAROXABAN 20 MG PO TABS: 20.0000 mg | ORAL_TABLET | Freq: Every day | ORAL | 11 refills | Status: AC

## 2018-10-08 MED ORDER — SODIUM CHLORIDE 0.9 % WEIGHT BASED INFUSION: 1.0000 mL/kg/h | INTRAVENOUS | Status: AC

## 2018-10-08 MED ORDER — SODIUM CHLORIDE 0.9% FLUSH: 3.0000 mL | Freq: Two times a day (BID) | INTRAVENOUS | Status: DC

## 2018-10-08 MED ORDER — LIDOCAINE HCL (PF) 1 % IJ SOLN
INTRAMUSCULAR | Status: AC
  Filled 2018-10-08: qty 30

## 2018-10-08 MED ORDER — SODIUM CHLORIDE 0.9 % WEIGHT BASED INFUSION: 1.0000 mL/kg/h | INTRAVENOUS | Status: DC

## 2018-10-08 MED ORDER — ASPIRIN 81 MG PO CHEW: 81.0000 mg | CHEWABLE_TABLET | ORAL | Status: DC

## 2018-10-08 MED ORDER — VERAPAMIL HCL 2.5 MG/ML IV SOLN
INTRAVENOUS | Status: AC
  Filled 2018-10-08: qty 2

## 2018-10-08 MED ORDER — IOHEXOL 350 MG/ML SOLN
INTRAVENOUS | Status: DC | PRN
  Administered 2018-10-08: 60 mL via INTRAVENOUS

## 2018-10-08 MED ORDER — SODIUM CHLORIDE 0.9% FLUSH: 3.0000 mL | INTRAVENOUS | Status: DC | PRN

## 2018-10-08 MED ORDER — ACETAMINOPHEN 325 MG PO TABS: 650.0000 mg | ORAL_TABLET | ORAL | Status: DC | PRN

## 2018-10-08 MED ORDER — HEPARIN SODIUM (PORCINE) 1000 UNIT/ML IJ SOLN
INTRAMUSCULAR | Status: DC | PRN
  Administered 2018-10-08: 5500 [IU] via INTRAVENOUS

## 2018-10-08 MED ORDER — MIDAZOLAM HCL 2 MG/2ML IJ SOLN
INTRAMUSCULAR | Status: AC
  Filled 2018-10-08: qty 2

## 2018-10-08 MED ORDER — SODIUM CHLORIDE 0.9 % IV SOLN: 250.0000 mL | INTRAVENOUS | Status: DC | PRN

## 2018-10-08 MED ORDER — VERAPAMIL HCL 2.5 MG/ML IV SOLN
INTRAVENOUS | Status: DC | PRN
  Administered 2018-10-08: 10:00:00 via INTRA_ARTERIAL

## 2018-10-08 MED ORDER — METFORMIN HCL ER 500 MG PO TB24: 500.0000 mg | ORAL_TABLET | Freq: Every day | ORAL | 6 refills | Status: AC

## 2018-10-08 MED ORDER — LIDOCAINE HCL (PF) 1 % IJ SOLN
INTRAMUSCULAR | Status: DC | PRN
  Administered 2018-10-08: 2 mL

## 2018-10-08 MED ORDER — HEPARIN (PORCINE) IN NACL 1000-0.9 UT/500ML-% IV SOLN
INTRAVENOUS | Status: DC | PRN
  Administered 2018-10-08: 1000 mL

## 2018-10-08 MED ORDER — FENTANYL CITRATE (PF) 100 MCG/2ML IJ SOLN
INTRAMUSCULAR | Status: AC
  Filled 2018-10-08: qty 2

## 2018-10-08 MED ORDER — SODIUM CHLORIDE 0.9 % WEIGHT BASED INFUSION
3.0000 mL/kg/h | INTRAVENOUS | Status: AC
  Administered 2018-10-08: 3 mL/kg/h via INTRAVENOUS

## 2018-10-08 MED ORDER — HEPARIN (PORCINE) IN NACL 1000-0.9 UT/500ML-% IV SOLN
INTRAVENOUS | Status: AC
  Filled 2018-10-08: qty 1000

## 2018-10-08 SURGICAL SUPPLY — 10 items
CATH 5FR JL3.5 JR4 ANG PIG MP (CATHETERS) ×1 IMPLANT
DEVICE RAD COMP TR BAND LRG (VASCULAR PRODUCTS) ×1 IMPLANT
GLIDESHEATH SLEND SS 6F .021 (SHEATH) ×1 IMPLANT
GUIDEWIRE INQWIRE 1.5J.035X260 (WIRE) IMPLANT
INQWIRE 1.5J .035X260CM (WIRE) ×2
KIT HEART LEFT (KITS) ×2 IMPLANT
PACK CARDIAC CATHETERIZATION (CUSTOM PROCEDURE TRAY) ×2 IMPLANT
SYR MEDRAD MARK V 150ML (SYRINGE) ×2 IMPLANT
TRANSDUCER W/STOPCOCK (MISCELLANEOUS) ×2 IMPLANT
TUBING CIL FLEX 10 FLL-RA (TUBING) ×2 IMPLANT

## 2018-10-08 NOTE — Interval H&P Note (Signed)
History and Physical Interval Note:  10/08/2018 10:09 AM  Frank Trujillo  has presented today for surgery, with the diagnosis of ua  The various methods of treatment have been discussed with the patient and family. After consideration of risks, benefits and other options for treatment, the patient has consented to  Procedure(s): LEFT HEART CATH AND CORONARY ANGIOGRAPHY (N/A) as a surgical intervention .  The patient's history has been reviewed, patient examined, no change in status, stable for surgery.  I have reviewed the patient's chart and labs.  Questions were answered to the patient's satisfaction.   Cath Lab Visit (complete for each Cath Lab visit)  Clinical Evaluation Leading to the Procedure:   ACS: Yes.    Non-ACS:    Anginal Classification: CCS III  Anti-ischemic medical therapy: Minimal Therapy (1 class of medications)  Non-Invasive Test Results: No non-invasive testing performed  Prior CABG: No previous CABG        Collier [facility] 10/08/2018 10:09 AM

## 2018-10-08 NOTE — Discharge Instructions (Signed)
Drink plenty of fluids. Keep right arm at or above heat level.  Radial Site Care Refer to this sheet in the next few weeks. These instructions provide you with information about caring for yourself after your procedure. Your health care provider may also give you more specific instructions. Your treatment has been planned according to current medical practices, but problems sometimes occur. Call your health care provider if you have any problems or questions after your procedure. What can I expect after the procedure? After your procedure, it is typical to have the following:  Bruising at the radial site that usually fades within 1-2 weeks.  Blood collecting in the tissue (hematoma) that may be painful to the touch. It should usually decrease in size and tenderness within 1-2 weeks.  Follow these instructions at home:  Take medicines only as directed by your health care provider.  You may shower 24-48 hours after the procedure or as directed by your health care provider. Remove the bandage (dressing) and gently wash the site with plain soap and water. Pat the area dry with a clean towel. Do not rub the site, because this may cause bleeding.  Do not take baths, swim, or use a hot tub until your health care provider approves.  Check your insertion site every day for redness, swelling, or drainage.  Do not apply powder or lotion to the site.  Do not flex or bend the affected arm for 24 hours or as directed by your health care provider.  Do not push or pull heavy objects with the affected arm for 24 hours or as directed by your health care provider.  Do not lift over 10 lb (4.5 kg) for 5 days after your procedure or as directed by your health care provider.  Ask your health care provider when it is okay to: ? Return to work or school. ? Resume usual physical activities or sports. ? Resume sexual activity.  Do not drive home if you are discharged the same day as the procedure. Have  someone else drive you.  You may drive 24 hours after the procedure unless otherwise instructed by your health care provider.  Do not operate machinery or power tools for 24 hours after the procedure.  If your procedure was done as an outpatient procedure, which means that you went home the same day as your procedure, a responsible adult should be with you for the first 24 hours after you arrive home.  Keep all follow-up visits as directed by your health care provider. This is important. Contact a health care provider if:  You have a fever.  You have chills.  You have increased bleeding from the radial site. Hold pressure on the site. Get help right away if:  You have unusual pain at the radial site.  You have redness, warmth, or swelling at the radial site.  You have drainage (other than a small amount of blood on the dressing) from the radial site.  The radial site is bleeding, and the bleeding does not stop after 30 minutes of holding steady pressure on the site.  Your arm or hand becomes pale, cool, tingly, or numb. This information is not intended to replace advice given to you by your health care provider. Make sure you discuss any questions you have with your health care provider. Document Released: 01/06/2011 Document Revised: 05/11/2016 Document Reviewed: 06/22/2014 Elsevier Interactive Patient Education  2018 [employer].

## 2018-10-09 ENCOUNTER — Telehealth: Payer: Self-pay | Admitting: *Deleted

## 2018-10-09 DIAGNOSIS — I517 Cardiomegaly: Secondary | ICD-10-CM

## 2018-10-09 NOTE — Telephone Encounter (Signed)
-----   Message from Minus Breeding, MD sent at 10/08/2018  1:05 PM EDT ----- Please schedule a follow up echo at his convenience.  Evaluate LVH.    ----- Message ----- From: [place], Peter M, MD Sent: 10/08/2018  10:51 AM EDT To: Minus Breeding, MD

## 2018-10-09 NOTE — Telephone Encounter (Signed)
Echo Order and send to scheduler to be schedule

## 2018-10-17 ENCOUNTER — Ambulatory Visit (HOSPITAL_COMMUNITY): Payer: Medicare Other | Attending: Cardiology

## 2018-10-17 ENCOUNTER — Other Ambulatory Visit: Payer: Self-pay

## 2018-10-17 DIAGNOSIS — I517 Cardiomegaly: Secondary | ICD-10-CM | POA: Diagnosis not present

## 2018-10-21 ENCOUNTER — Encounter: Payer: Self-pay | Admitting: Physician Assistant

## 2018-10-21 ENCOUNTER — Ambulatory Visit (INDEPENDENT_AMBULATORY_CARE_PROVIDER_SITE_OTHER): Payer: Medicare Other | Admitting: Physician Assistant

## 2018-10-21 VITALS — BP 138/100 | HR 72 | Ht 74.0 in | Wt 250.6 lb

## 2018-10-21 DIAGNOSIS — I422 Other hypertrophic cardiomyopathy: Secondary | ICD-10-CM

## 2018-10-21 DIAGNOSIS — I4819 Other persistent atrial fibrillation: Secondary | ICD-10-CM

## 2018-10-21 DIAGNOSIS — I10 Essential (primary) hypertension: Secondary | ICD-10-CM | POA: Diagnosis not present

## 2018-10-21 DIAGNOSIS — I200 Unstable angina: Secondary | ICD-10-CM

## 2018-10-21 DIAGNOSIS — E119 Type 2 diabetes mellitus without complications: Secondary | ICD-10-CM

## 2018-10-21 MED ORDER — METOPROLOL SUCCINATE ER 50 MG PO TB24: 50.0000 mg | ORAL_TABLET | Freq: Every day | ORAL | 1 refills | Status: DC

## 2018-10-21 MED ORDER — METOPROLOL SUCCINATE ER 25 MG PO TB24: 25.0000 mg | ORAL_TABLET | Freq: Every day | ORAL | 1 refills | Status: DC
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
## 2018-10-21 NOTE — Patient Instructions (Addendum)
Medication Instructions:  Start Metoprolol 25 mg daily.  If you need a refill on your cardiac medications before your next appointment, please call your pharmacy.   Lab work: None Ordered. If you have labs (blood work) drawn today and your tests are completely normal, you will receive your results only by: Marland Kitchen MyChart Message (if you have MyChart) OR . A paper copy in the mail If you have any lab test that is abnormal or we need to change your treatment, we will call you to review the results.  Testing/Procedures: None Ordered.  Follow-Up: At [facility], you and your health needs are our priority.  As part of our continuing mission to provide you with exceptional heart care, we have created designated Provider Care Teams.  These Care Teams include your primary Cardiologist (physician) and Advanced Practice Providers (APPs -  Physician Assistants and Nurse Practitioners) who all work together to provide you with the care you need, when you need it. You will need a follow up appointment in 3 months.  Please call our office 2 months in advance to schedule this appointment.  You may see Dr.Hochrein or one of the following Advanced Practice Providers on your designated Care Team:   Rosaria Ferries, PA-C . Jory Sims, DNP, ANP  Also have a follow up appointment made in 3 weeks with AFIB.  Any Other Special Instructions Will Be Listed Below (If Applicable). None

## 2018-10-23 ENCOUNTER — Encounter: Payer: Self-pay | Admitting: Physician Assistant

## 2018-10-23 DIAGNOSIS — Z23 Encounter for immunization: Secondary | ICD-10-CM | POA: Diagnosis not present

## 2018-11-11 ENCOUNTER — Ambulatory Visit (HOSPITAL_COMMUNITY)
Admission: RE | Admit: 2018-11-11 | Discharge: 2018-11-11 | Disposition: A | Discharge status: Home / Self Care | Payer: Medicare Other | Source: Ambulatory Visit | Attending: Nurse Practitioner | Admitting: Nurse Practitioner

## 2018-11-11 ENCOUNTER — Encounter (HOSPITAL_COMMUNITY): Payer: Self-pay | Admitting: Nurse Practitioner

## 2018-11-11 VITALS — BP 136/84 | HR 80 | Ht 74.0 in | Wt 250.0 lb

## 2018-11-11 DIAGNOSIS — I4821 Permanent atrial fibrillation: Secondary | ICD-10-CM | POA: Insufficient documentation

## 2018-11-11 DIAGNOSIS — Z8249 Family history of ischemic heart disease and other diseases of the circulatory system: Secondary | ICD-10-CM | POA: Diagnosis not present

## 2018-11-11 DIAGNOSIS — Z79899 Other long term (current) drug therapy: Secondary | ICD-10-CM | POA: Diagnosis not present

## 2018-11-11 DIAGNOSIS — Z7984 Long term (current) use of oral hypoglycemic drugs: Secondary | ICD-10-CM | POA: Insufficient documentation

## 2018-11-11 DIAGNOSIS — Z87891 Personal history of nicotine dependence: Secondary | ICD-10-CM | POA: Diagnosis not present

## 2018-11-11 DIAGNOSIS — I422 Other hypertrophic cardiomyopathy: Secondary | ICD-10-CM | POA: Insufficient documentation

## 2018-11-11 DIAGNOSIS — J45909 Unspecified asthma, uncomplicated: Secondary | ICD-10-CM | POA: Insufficient documentation

## 2018-11-11 DIAGNOSIS — Z7901 Long term (current) use of anticoagulants: Secondary | ICD-10-CM | POA: Diagnosis not present
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
## 2018-11-22 ENCOUNTER — Telehealth: Payer: Self-pay

## 2018-11-22 NOTE — Telephone Encounter (Signed)
Left a VM for Mr. Cordts regarding the PREP at the [facility].  Asked for him to call back to discuss details.

## 2018-11-27 ENCOUNTER — Telehealth: Payer: Self-pay

## 2018-11-27 NOTE — Telephone Encounter (Signed)
Left VM for Mr. Rubenstein to call back regarding the 12-week PREP at the [facility].

## 2018-12-30 ENCOUNTER — Telehealth: Payer: Self-pay

## 2018-12-30 NOTE — Telephone Encounter (Signed)
Spoke to Frank Trujillo who states he will be unable to commit to a 12-week program at this time d/t frequent travel w/his wife.  States he does go to [employer] to use treadmill.  He understands there are new PREP classes every 14 or so weeks and he is welcome to join in one when he is able.

## 2019-01-03 DIAGNOSIS — R05 Cough: Secondary | ICD-10-CM | POA: Diagnosis not present

## 2019-01-03 DIAGNOSIS — J0100 Acute maxillary sinusitis, unspecified: Secondary | ICD-10-CM | POA: Diagnosis not present

## 2019-01-03 DIAGNOSIS — R0602 Shortness of breath: Secondary | ICD-10-CM | POA: Diagnosis not present
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
## 2019-01-20 ENCOUNTER — Encounter: Payer: Self-pay | Admitting: Cardiology

## 2019-01-20 ENCOUNTER — Ambulatory Visit (INDEPENDENT_AMBULATORY_CARE_PROVIDER_SITE_OTHER): Payer: Medicare Other | Admitting: Cardiology

## 2019-01-20 VITALS — BP 144/82 | HR 81 | Ht 74.0 in | Wt 253.2 lb

## 2019-01-20 DIAGNOSIS — Z79899 Other long term (current) drug therapy: Secondary | ICD-10-CM

## 2019-01-20 DIAGNOSIS — I421 Obstructive hypertrophic cardiomyopathy: Secondary | ICD-10-CM

## 2019-01-20 DIAGNOSIS — I482 Chronic atrial fibrillation, unspecified: Secondary | ICD-10-CM

## 2019-01-20 DIAGNOSIS — I422 Other hypertrophic cardiomyopathy: Secondary | ICD-10-CM | POA: Diagnosis not present

## 2019-01-20 NOTE — Patient Instructions (Addendum)
Medication Instructions:  Continue current medications  If you need a refill on your cardiac medications before your next appointment, please call your pharmacy.  Labwork: BMP  Take the provided lab slips with you to the lab for your blood draw.   When you have your labs (blood work) drawn today and your tests are completely normal, you will receive your results only by MyChart Message (if you have MyChart) -OR-  A paper copy in the mail.  If you have any lab test that is abnormal or we need to change your treatment, we will call you to review these results.  Testing/Procedures: Your physician has requested that you have a MRI   Follow-Up: You will need a follow up appointment in 1 Year.  Please call our office 2 months in advance to schedule this appointment.  You may see Dr Percival Spanish or one of the following Advanced Practice Providers on your designated Care Team:   Rosaria Ferries, PA-C . Jory Sims, DNP, ANP    At [facility], you and your health needs are our priority.  As part of our continuing mission to provide you with exceptional heart care, we have created designated Provider Care Teams.  These Care Teams include your primary Cardiologist (physician) and Advanced Practice Providers (APPs -  Physician Assistants and Nurse Practitioners) who all work together to provide you with the care you need, when you need it.  Thank you for choosing [facility] at [facility]!!

## 2019-01-21 ENCOUNTER — Telehealth: Payer: Self-pay | Admitting: Cardiology

## 2019-01-21 DIAGNOSIS — R05 Cough: Secondary | ICD-10-CM | POA: Diagnosis not present

## 2019-01-21 NOTE — Telephone Encounter (Signed)
New message  Pt needs to schedule mri

## 2019-01-30 DIAGNOSIS — J069 Acute upper respiratory infection, unspecified: Secondary | ICD-10-CM | POA: Diagnosis not present

## 2019-01-30 DIAGNOSIS — B001 Herpesviral vesicular dermatitis: Secondary | ICD-10-CM | POA: Diagnosis not present

## 2019-01-30 DIAGNOSIS — R05 Cough: Secondary | ICD-10-CM | POA: Diagnosis not present

## 2019-01-30 DIAGNOSIS — R509 Fever, unspecified: Secondary | ICD-10-CM | POA: Diagnosis not present

## 2019-02-07 ENCOUNTER — Telehealth (HOSPITAL_COMMUNITY): Payer: Self-pay | Admitting: Emergency Medicine

## 2019-02-07 NOTE — Telephone Encounter (Signed)
Reaching out to patient to offer assistance regarding upcoming cardiac imaging study; pt verbalizes understanding of appt date/time, parking situation and where to check in, and verified current allergies; name and call back number provided for further questions should they arise Rustyn Conery RN Navigator [facility] [phone] office [phone] cell 

## 2019-02-10 ENCOUNTER — Ambulatory Visit (HOSPITAL_COMMUNITY)
Admission: RE | Admit: 2019-02-10 | Discharge: 2019-02-10 | Disposition: A | Discharge status: Home / Self Care | Payer: Medicare Other | Source: Ambulatory Visit | Attending: Cardiology | Admitting: Cardiology

## 2019-02-10 DIAGNOSIS — I421 Obstructive hypertrophic cardiomyopathy: Secondary | ICD-10-CM | POA: Insufficient documentation

## 2019-02-10 DIAGNOSIS — I422 Other hypertrophic cardiomyopathy: Secondary | ICD-10-CM | POA: Insufficient documentation

## 2019-02-10 LAB — CREATININE, SERUM
Creatinine, Ser: 1.05 mg/dL (ref 0.61–1.24)
GFR calc non Af Amer: >60 mL/min (ref >60)

## 2019-02-10 MED ORDER — GADOBUTROL 1 MMOL/ML IV SOLN
12.0000 mL | Freq: Once | INTRAVENOUS | Status: AC | PRN
  Administered 2019-02-10: 12 mL via INTRAVENOUS

## 2019-02-13 DIAGNOSIS — E1140 Type 2 diabetes mellitus with diabetic neuropathy, unspecified: Secondary | ICD-10-CM | POA: Diagnosis not present

## 2019-02-13 DIAGNOSIS — I422 Other hypertrophic cardiomyopathy: Secondary | ICD-10-CM | POA: Diagnosis not present

## 2019-02-18 ENCOUNTER — Telehealth: Payer: Self-pay | Admitting: *Deleted

## 2019-02-18 DIAGNOSIS — I422 Other hypertrophic cardiomyopathy: Secondary | ICD-10-CM

## 2019-02-18 DIAGNOSIS — R0602 Shortness of breath: Secondary | ICD-10-CM

## 2019-02-18 NOTE — Telephone Encounter (Signed)
BNP ordered, referral ordered for Dr Lattie Corns. Pt made aware of referral and labs mailed to pt

## 2019-02-18 NOTE — Telephone Encounter (Signed)
-----   Message from Minus Breeding, MD sent at 02/17/2019  2:25 PM EST ----- Discussed results of MRI.  Basal septal and apical hypertrophy.  He is having some SOB.  He has had no high risk findings for arrhythmia in the past.  I plan to bring him back to discuss repeat POET (Plain Old Exercise Treadmill) for BP response and or repeat Holter for ventricular arrhythmias.  I will order now a BNP and refer him for genetics consult to Dr. Broadus John.  Send results to Lawerance Cruel, MD

## 2019-03-05 DIAGNOSIS — R0602 Shortness of breath: Secondary | ICD-10-CM | POA: Diagnosis not present

## 2019-03-06 ENCOUNTER — Telehealth: Payer: Self-pay | Admitting: Physician Assistant

## 2019-03-06 LAB — BRAIN NATRIURETIC PEPTIDE: BNP: 168.9 pg/mL — AB (ref 0.0–100.0)

## 2019-03-06 NOTE — Telephone Encounter (Signed)
   Primary Cardiologist:  Minus Breeding, MD   Patient contacted.  History reviewed.  No symptoms to suggest any unstable cardiac conditions.  Based on discussion, with current pandemic situation, we will be postponing this appointment for Frank Trujillo.  If symptoms change, he has been instructed to contact our office.   Routing to C19 CANCEL pool for tracking (P CV DIV CV19 CANCEL) and assigning priority (1 = 4-6 wks, 2 = 6-12 wks (2 month), 3 = >12 wks).  [place], [place]  03/06/2019 3:22 PM         .

## 2019-03-06 NOTE — Telephone Encounter (Addendum)
I have called this patient, he feels his shortness of breath is getting slightly worse, but no chest pain. Despite HOCM seen on recent cardiac MRI, he denies any dizziness, blurred vision or feeling of passing out. I think even though he is having worsening shortness of breath, waiting another 3-4 weeks should be relatively safe in this case. Recent note mentioned POET +/- Holter monitor, but those can be delayed until later as well. Will defer to Dr. Percival Spanish. Also patient has genetic consultation in early Apr, I assume that can be delayed as well.

## 2019-03-06 NOTE — Telephone Encounter (Signed)
Yes.  These studies can be delayed.  He should call us with any change in symptoms.  Otherwise can see in two months.

## 2019-03-10 ENCOUNTER — Ambulatory Visit: Payer: Medicare Other | Admitting: Cardiology

## 2019-03-27 ENCOUNTER — Encounter: Payer: Medicare Other | Admitting: Genetic Counselor

## 2019-03-27 ENCOUNTER — Other Ambulatory Visit: Payer: Self-pay

## 2019-03-27 ENCOUNTER — Telehealth: Payer: Medicare Other | Admitting: Genetic Counselor

## 2019-03-27 DIAGNOSIS — I422 Other hypertrophic cardiomyopathy: Secondary | ICD-10-CM

## 2019-04-02 ENCOUNTER — Telehealth: Payer: Self-pay | Admitting: Cardiology

## 2019-04-02 NOTE — Telephone Encounter (Signed)
tient does not wish to r/s appt.  He saw notes in My Chart about his MRI and is satisfied at this time.

## 2019-04-03 ENCOUNTER — Encounter: Payer: Self-pay | Admitting: Genetic Counselor

## 2019-04-03 NOTE — Progress Notes (Signed)
Pre Test GC  Due to the COVID-19 pandemic, patient consents to having a virtual genetic consult and for the visit details to be documented in his notes. Patient identity was confirmed using two unique identifiers of full name and date of birth   Referral Reason  Frank Trujillo was referred for genetic testing of HCM by Dr. Percival Spanish subsequent to findings in recent imaging studies indicating apical form of hypertrophic cardiomyopathy.   Genetic Consultation Notes  Frank Trujillo was counseled on the genetics of hypertrophic cardiomyopathy (HCM), namely its autosomal dominant inheritance. We also talked about incomplete penetrance, variable expression and digenic/compound mutations that can be seen in some patients with HCM. We briefly discussed the inheritance pattern and treatment plans for the infiltrative cardiomyopathies that present as HCM phenocopies.   We walked through the process of genetic testing. I explained to him that there are three possible outcomes of genetic testing; namely positive, negative and variant of unknown significance. A positive outcome can be expected in about 70%-75% of HCM cases that do not have risk factors for HCM, present early in life with increased severity and have a family history of sudden cardiac death and/or a relative that has been diagnosed with HCM. A negative test does not exclude a genetic basis for HCM. Limitations in current genetic testing methodology can produce a negative result. Variants of unknown significance (VUS) are typically observed in about 10%-15% of cases. However, this number can increase if genetic testing is performed by a laboratory that employs extremely large gene panels of 9 or more sarcomeric genes. I explained to him that typically a VUS is so classified if the variant is not well understood as very few individuals have been reported to harbor this variant or its role in gene function has not been elucidated. The potential outcomes of  genetic testing and subsequent management of at-risk family members were discussed so as to manage expectations. His medical and 4-generation family history was obtained. See details below-  Frank Trujillo (III.4 on pedigree) is a very pleasant 68 year-old Caucasian gentleman. He was diagnosed with apical HCM IN October 2019 when he sought medical care for worsening symptoms of dyspnea and chest pains. He began experiencing shortness of breath about 2 years ago and attributed that to COPD since he previously was a heavy smoker. He was referred to Dr. Percival Spanish and subsequent imaging indicated apical thickening of the heart. He has had chest pains for the last 1.5 years and had a normal heart cath. He tells me that he is in Afib all the time but it does not impact his daily activities.    Traditional Risk Factors Frank Trujillo does not have hypertension or aortic valve stenosis that can lead to cardiac hypertrophy.  Family history Frank Trujillo (III.4) is the fourth child of 6 children. His eldest brother (III.1) had several strokes about 10-15 years. He had a pacemaker at 56 and knows that his brother does not have a cardiomyopathy but has arrhythmias. His youngest brother (III.6) has some heart issues but suspects it is form being overweight. His other siblings (III.2, III.3, III.5) do not have heart issues.   Frank Trujillo has two healthy children, a son age 28 (IV.4) and a daughter age 30 (IV.5). He informs me that his son fainted once at age 35 and suspects it was from heat exposure. He was taken to the hospital and was given fluids. His daughter has also fainted twice, both times from heat exposure. Both his kids have not been  screened for HCM.   There is no history of heart disease in his paternal relatives. His father (II.5) died of cancer at age 36. His mother (II.6) is now 24 and was recently found to have a heart murmur. She too has Afib. Her younger brother (II.7) had heart failure in his  late 54s and died in his 8s. He was a heavy drinker and smoker and suspects that contributed to his heart failure. Darrels maternal grandfather (I.3) came home one day carrying water from the well, sat on the chair and dropped dead. Frank Trujillo states that he did not have prior history of heart disease. He is not sure if an autopsy was done, but the family was told that he died of a massive coronary event. He was 69.  Impression  In summary, Frank Trujillo presents with apical HCM in his late 77s in the absence of traditional risk factors. There is no history of heart disease in his father or his paternal relatives. His mother is 19 and has not been diagnosed with HCM. He reports a second-degree maternal relative- grandfather that died suddenly, although from a suspected heart attack and a maternal uncle that likely died of heart failure.    Genetic testing for HCM is recommended. However, the yield of genetic testing is expected to be low as he has late age of presentation and does not have a first-degree relative with HCM/SCD. Frank Trujillo et al. 2018 reports that genetic testing in individuals with apical HCM have a yield of 5% (pure apical form) to 13% (distal-dominant form). I explained to him that with his insurance not covering genetic testing and the yield of the test being very low, regular surveillance and management of his condition would be advisable. Likewise, it is recommended that his children seek regular cardiology screening for HCM. He verbalized understanding.   Frank Trujillo, Ph.D, [facility] Clinical Molecular Geneticist

## 2019-04-04 NOTE — Telephone Encounter (Signed)
Creed, Deedie C       04/02/19 12:24 PM  Note    tient does not wish to r/s appt.  He saw notes in My Chart about his MRI and is satisfied at this time.

## 2019-04-14 ENCOUNTER — Other Ambulatory Visit: Payer: Self-pay

## 2019-04-14 MED ORDER — METOPROLOL SUCCINATE ER 25 MG PO TB24: 25.0000 mg | ORAL_TABLET | Freq: Every day | ORAL | 1 refills | Status: DC

## 2019-04-24 NOTE — Addendum Note (Signed)
Addended by: Debbe Mounts on: 04/24/2019 11:43 AM   Modules accepted: Level of Service

## 2019-09-23 DIAGNOSIS — E1140 Type 2 diabetes mellitus with diabetic neuropathy, unspecified: Secondary | ICD-10-CM | POA: Diagnosis not present

## 2019-09-23 DIAGNOSIS — I422 Other hypertrophic cardiomyopathy: Secondary | ICD-10-CM | POA: Diagnosis not present

## 2019-09-23 DIAGNOSIS — Z0000 Encounter for general adult medical examination without abnormal findings: Secondary | ICD-10-CM | POA: Diagnosis not present

## 2019-09-23 DIAGNOSIS — B001 Herpesviral vesicular dermatitis: Secondary | ICD-10-CM | POA: Diagnosis not present

## 2019-09-23 DIAGNOSIS — Z125 Encounter for screening for malignant neoplasm of prostate: Secondary | ICD-10-CM | POA: Diagnosis not present

## 2019-09-23 DIAGNOSIS — J4541 Moderate persistent asthma with (acute) exacerbation: Secondary | ICD-10-CM | POA: Diagnosis not present

## 2019-09-23 DIAGNOSIS — N529 Male erectile dysfunction, unspecified: Secondary | ICD-10-CM | POA: Diagnosis not present

## 2019-09-23 DIAGNOSIS — I4891 Unspecified atrial fibrillation: Secondary | ICD-10-CM | POA: Diagnosis not present

## 2019-09-23 DIAGNOSIS — J449 Chronic obstructive pulmonary disease, unspecified: Secondary | ICD-10-CM | POA: Diagnosis not present

## 2019-09-24 DIAGNOSIS — Z23 Encounter for immunization: Secondary | ICD-10-CM | POA: Diagnosis not present

## 2019-09-24 DIAGNOSIS — N529 Male erectile dysfunction, unspecified: Secondary | ICD-10-CM | POA: Diagnosis not present

## 2019-09-24 DIAGNOSIS — E1140 Type 2 diabetes mellitus with diabetic neuropathy, unspecified: Secondary | ICD-10-CM | POA: Diagnosis not present

## 2019-09-24 DIAGNOSIS — Z0000 Encounter for general adult medical examination without abnormal findings: Secondary | ICD-10-CM | POA: Diagnosis not present

## 2019-09-24 DIAGNOSIS — J449 Chronic obstructive pulmonary disease, unspecified: Secondary | ICD-10-CM | POA: Diagnosis not present

## 2019-09-24 DIAGNOSIS — Z125 Encounter for screening for malignant neoplasm of prostate: Secondary | ICD-10-CM | POA: Diagnosis not present

## 2019-09-24 DIAGNOSIS — J4541 Moderate persistent asthma with (acute) exacerbation: Secondary | ICD-10-CM | POA: Diagnosis not present

## 2019-09-24 DIAGNOSIS — I4891 Unspecified atrial fibrillation: Secondary | ICD-10-CM | POA: Diagnosis not present

## 2019-09-24 DIAGNOSIS — B001 Herpesviral vesicular dermatitis: Secondary | ICD-10-CM | POA: Diagnosis not present

## 2019-10-14 DIAGNOSIS — E7800 Pure hypercholesterolemia, unspecified: Secondary | ICD-10-CM | POA: Diagnosis not present

## 2019-10-14 DIAGNOSIS — J449 Chronic obstructive pulmonary disease, unspecified: Secondary | ICD-10-CM | POA: Diagnosis not present

## 2019-10-14 DIAGNOSIS — I209 Angina pectoris, unspecified: Secondary | ICD-10-CM | POA: Diagnosis not present

## 2019-10-14 DIAGNOSIS — E1140 Type 2 diabetes mellitus with diabetic neuropathy, unspecified: Secondary | ICD-10-CM | POA: Diagnosis not present

## 2019-10-14 DIAGNOSIS — I482 Chronic atrial fibrillation, unspecified: Secondary | ICD-10-CM | POA: Diagnosis not present

## 2019-10-14 DIAGNOSIS — I4891 Unspecified atrial fibrillation: Secondary | ICD-10-CM | POA: Diagnosis not present

## 2019-10-14 DIAGNOSIS — J4541 Moderate persistent asthma with (acute) exacerbation: Secondary | ICD-10-CM | POA: Diagnosis not present

## 2019-10-14 DIAGNOSIS — J45909 Unspecified asthma, uncomplicated: Secondary | ICD-10-CM | POA: Diagnosis not present

## 2019-11-26 DIAGNOSIS — E119 Type 2 diabetes mellitus without complications: Secondary | ICD-10-CM | POA: Diagnosis not present

## 2019-11-26 DIAGNOSIS — H5203 Hypermetropia, bilateral: Secondary | ICD-10-CM | POA: Diagnosis not present

## 2019-12-15 ENCOUNTER — Other Ambulatory Visit: Payer: Self-pay | Admitting: Physician Assistant

## 2020-01-06 DIAGNOSIS — Z23 Encounter for immunization: Secondary | ICD-10-CM | POA: Diagnosis not present

## 2020-01-14 DIAGNOSIS — E7800 Pure hypercholesterolemia, unspecified: Secondary | ICD-10-CM | POA: Diagnosis not present

## 2020-01-14 DIAGNOSIS — R05 Cough: Secondary | ICD-10-CM | POA: Diagnosis not present

## 2020-01-14 DIAGNOSIS — E1140 Type 2 diabetes mellitus with diabetic neuropathy, unspecified: Secondary | ICD-10-CM | POA: Diagnosis not present

## 2020-01-14 DIAGNOSIS — J449 Chronic obstructive pulmonary disease, unspecified: Secondary | ICD-10-CM | POA: Diagnosis not present

## 2020-01-14 DIAGNOSIS — I209 Angina pectoris, unspecified: Secondary | ICD-10-CM | POA: Diagnosis not present

## 2020-01-14 DIAGNOSIS — Z1152 Encounter for screening for COVID-19: Secondary | ICD-10-CM | POA: Diagnosis not present

## 2020-01-14 DIAGNOSIS — J45909 Unspecified asthma, uncomplicated: Secondary | ICD-10-CM | POA: Diagnosis not present

## 2020-01-14 DIAGNOSIS — J4541 Moderate persistent asthma with (acute) exacerbation: Secondary | ICD-10-CM | POA: Diagnosis not present

## 2020-01-14 DIAGNOSIS — I482 Chronic atrial fibrillation, unspecified: Secondary | ICD-10-CM | POA: Diagnosis not present

## 2020-01-14 DIAGNOSIS — I4891 Unspecified atrial fibrillation: Secondary | ICD-10-CM | POA: Diagnosis not present

## 2020-01-19 ENCOUNTER — Telehealth: Payer: Self-pay | Admitting: Nurse Practitioner

## 2020-01-19 NOTE — Telephone Encounter (Signed)
Called to Discuss with patient about Covid symptoms and the use of bamlanivimab, a monoclonal antibody infusion for those with mild to moderate Covid symptoms and at a high risk of hospitalization.     Pt states that symptoms started over 10 days ago and therefore is not a candidate for infusion. Patient does have an appointment scheduled with PCP this afternoon. Advised if symptoms worsen to go on to urgent care or ED.

## 2020-01-21 DIAGNOSIS — E7800 Pure hypercholesterolemia, unspecified: Secondary | ICD-10-CM | POA: Diagnosis not present

## 2020-01-21 DIAGNOSIS — Z7984 Long term (current) use of oral hypoglycemic drugs: Secondary | ICD-10-CM | POA: Diagnosis not present

## 2020-01-21 DIAGNOSIS — J45909 Unspecified asthma, uncomplicated: Secondary | ICD-10-CM | POA: Diagnosis not present

## 2020-01-21 DIAGNOSIS — J449 Chronic obstructive pulmonary disease, unspecified: Secondary | ICD-10-CM | POA: Diagnosis not present

## 2020-01-21 DIAGNOSIS — I4891 Unspecified atrial fibrillation: Secondary | ICD-10-CM | POA: Diagnosis not present

## 2020-01-21 DIAGNOSIS — E1140 Type 2 diabetes mellitus with diabetic neuropathy, unspecified: Secondary | ICD-10-CM | POA: Diagnosis not present

## 2020-01-21 DIAGNOSIS — I209 Angina pectoris, unspecified: Secondary | ICD-10-CM | POA: Diagnosis not present

## 2020-01-21 DIAGNOSIS — J4541 Moderate persistent asthma with (acute) exacerbation: Secondary | ICD-10-CM | POA: Diagnosis not present

## 2020-01-26 DIAGNOSIS — U071 COVID-19: Secondary | ICD-10-CM | POA: Diagnosis not present

## 2020-02-03 DIAGNOSIS — Z23 Encounter for immunization: Secondary | ICD-10-CM | POA: Diagnosis not present

## 2020-03-12 DIAGNOSIS — Z7984 Long term (current) use of oral hypoglycemic drugs: Secondary | ICD-10-CM | POA: Diagnosis not present

## 2020-03-12 DIAGNOSIS — E7800 Pure hypercholesterolemia, unspecified: Secondary | ICD-10-CM | POA: Diagnosis not present

## 2020-03-12 DIAGNOSIS — I482 Chronic atrial fibrillation, unspecified: Secondary | ICD-10-CM | POA: Diagnosis not present

## 2020-03-12 DIAGNOSIS — J449 Chronic obstructive pulmonary disease, unspecified: Secondary | ICD-10-CM | POA: Diagnosis not present

## 2020-03-12 DIAGNOSIS — J45909 Unspecified asthma, uncomplicated: Secondary | ICD-10-CM | POA: Diagnosis not present

## 2020-03-12 DIAGNOSIS — J4541 Moderate persistent asthma with (acute) exacerbation: Secondary | ICD-10-CM | POA: Diagnosis not present

## 2020-03-12 DIAGNOSIS — I209 Angina pectoris, unspecified: Secondary | ICD-10-CM | POA: Diagnosis not present

## 2020-03-12 DIAGNOSIS — E1140 Type 2 diabetes mellitus with diabetic neuropathy, unspecified: Secondary | ICD-10-CM | POA: Diagnosis not present

## 2020-03-12 DIAGNOSIS — I4891 Unspecified atrial fibrillation: Secondary | ICD-10-CM | POA: Diagnosis not present

## 2020-04-13 DIAGNOSIS — I209 Angina pectoris, unspecified: Secondary | ICD-10-CM | POA: Diagnosis not present

## 2020-04-13 DIAGNOSIS — J45909 Unspecified asthma, uncomplicated: Secondary | ICD-10-CM | POA: Diagnosis not present

## 2020-04-13 DIAGNOSIS — I482 Chronic atrial fibrillation, unspecified: Secondary | ICD-10-CM | POA: Diagnosis not present

## 2020-04-13 DIAGNOSIS — E1140 Type 2 diabetes mellitus with diabetic neuropathy, unspecified: Secondary | ICD-10-CM | POA: Diagnosis not present

## 2020-04-13 DIAGNOSIS — J449 Chronic obstructive pulmonary disease, unspecified: Secondary | ICD-10-CM | POA: Diagnosis not present

## 2020-04-13 DIAGNOSIS — I4891 Unspecified atrial fibrillation: Secondary | ICD-10-CM | POA: Diagnosis not present

## 2020-04-13 DIAGNOSIS — E7800 Pure hypercholesterolemia, unspecified: Secondary | ICD-10-CM | POA: Diagnosis not present

## 2020-04-13 DIAGNOSIS — Z7984 Long term (current) use of oral hypoglycemic drugs: Secondary | ICD-10-CM | POA: Diagnosis not present

## 2020-04-13 DIAGNOSIS — J4541 Moderate persistent asthma with (acute) exacerbation: Secondary | ICD-10-CM | POA: Diagnosis not present

## 2020-04-19 ENCOUNTER — Other Ambulatory Visit: Payer: Self-pay | Admitting: Physician Assistant

## 2020-04-19 NOTE — Telephone Encounter (Signed)
Rx has been sent to the pharmacy electronically. ° °

## 2020-06-28 ENCOUNTER — Other Ambulatory Visit: Payer: Self-pay | Admitting: Physician Assistant

## 2020-10-14 DIAGNOSIS — J4541 Moderate persistent asthma with (acute) exacerbation: Secondary | ICD-10-CM | POA: Diagnosis not present

## 2020-10-14 DIAGNOSIS — E7800 Pure hypercholesterolemia, unspecified: Secondary | ICD-10-CM | POA: Diagnosis not present

## 2020-10-14 DIAGNOSIS — E1140 Type 2 diabetes mellitus with diabetic neuropathy, unspecified: Secondary | ICD-10-CM | POA: Diagnosis not present

## 2020-10-27 ENCOUNTER — Other Ambulatory Visit: Payer: Self-pay | Admitting: Physician Assistant

## 2020-11-05 ENCOUNTER — Other Ambulatory Visit: Payer: Self-pay | Admitting: Cardiology

## 2020-11-16 DIAGNOSIS — Z23 Encounter for immunization: Secondary | ICD-10-CM | POA: Diagnosis not present

## 2020-11-16 DIAGNOSIS — J45909 Unspecified asthma, uncomplicated: Secondary | ICD-10-CM | POA: Diagnosis not present

## 2020-11-16 DIAGNOSIS — D649 Anemia, unspecified: Secondary | ICD-10-CM | POA: Diagnosis not present

## 2020-11-16 DIAGNOSIS — I4891 Unspecified atrial fibrillation: Secondary | ICD-10-CM | POA: Diagnosis not present

## 2020-11-16 DIAGNOSIS — J449 Chronic obstructive pulmonary disease, unspecified: Secondary | ICD-10-CM | POA: Diagnosis not present

## 2020-11-16 DIAGNOSIS — Z79899 Other long term (current) drug therapy: Secondary | ICD-10-CM | POA: Diagnosis not present

## 2020-11-16 DIAGNOSIS — N529 Male erectile dysfunction, unspecified: Secondary | ICD-10-CM | POA: Diagnosis not present

## 2020-11-16 DIAGNOSIS — R300 Dysuria: Secondary | ICD-10-CM | POA: Diagnosis not present

## 2020-11-16 DIAGNOSIS — E119 Type 2 diabetes mellitus without complications: Secondary | ICD-10-CM | POA: Diagnosis not present

## 2020-12-13 DIAGNOSIS — J449 Chronic obstructive pulmonary disease, unspecified: Secondary | ICD-10-CM | POA: Diagnosis not present
